# Patient Record
Sex: Male | Born: 1990 | Race: White | Hispanic: No | Marital: Married | Smoking: Former smoker
Health system: Southern US, Community
[De-identification: ages and names within clinical notes are randomized; demographics above are authoritative.]

## PROBLEM LIST (undated history)

## (undated) DIAGNOSIS — I480 Paroxysmal atrial fibrillation: Secondary | ICD-10-CM

## (undated) DIAGNOSIS — J45909 Unspecified asthma, uncomplicated: Secondary | ICD-10-CM

## (undated) HISTORY — PX: TONSILLECTOMY: SUR1361

## (undated) HISTORY — DX: Unspecified asthma, uncomplicated: J45.909

## (undated) HISTORY — DX: Paroxysmal atrial fibrillation: I48.0

---
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
## 2008-01-07 ENCOUNTER — Emergency Department: Payer: Self-pay | Admitting: Emergency Medicine

## 2009-01-23 ENCOUNTER — Emergency Department: Payer: Self-pay | Admitting: Unknown Physician Specialty

## 2009-12-18 ENCOUNTER — Emergency Department: Payer: Self-pay | Admitting: Emergency Medicine

## 2009-12-20 ENCOUNTER — Emergency Department: Payer: Self-pay | Admitting: Emergency Medicine

## 2009-12-22 ENCOUNTER — Emergency Department: Payer: Self-pay | Admitting: Emergency Medicine

## 2010-02-28 ENCOUNTER — Emergency Department: Payer: Self-pay | Admitting: Unknown Physician Specialty

## 2010-10-02 ENCOUNTER — Emergency Department: Payer: Self-pay | Admitting: Emergency Medicine

## 2012-01-03 IMAGING — CT CT STONE STUDY
1 of 2 series · 15 of 32 positions shown, 19 images · non-contrast
Comparison: None

REASON FOR EXAM: Increasing left flnak pain
COMMENTS:   LMP: (Male)

PROCEDURE:     CT  - CT ABDOMEN /PELVIS WO (STONE)  - February 28, 2010  [DATE]
RESULT:     Indication: Left flank pain
TECHNIQUE: Multiple axial images from the lung bases to the symphysis pubis
were obtained without oral and without intravenous contrast.

[Series 2: stone · axial · 0.78mm/px · z∈[+281,+716]mm · 15 of 165 slices shown, 19 images]
[im 13/165  soft-tissue]
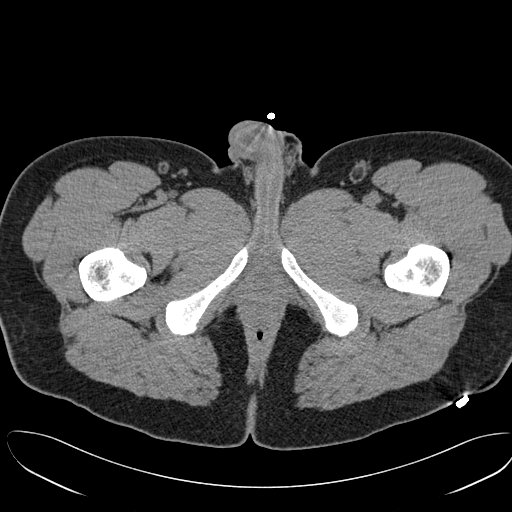
[im 13/165  bone]
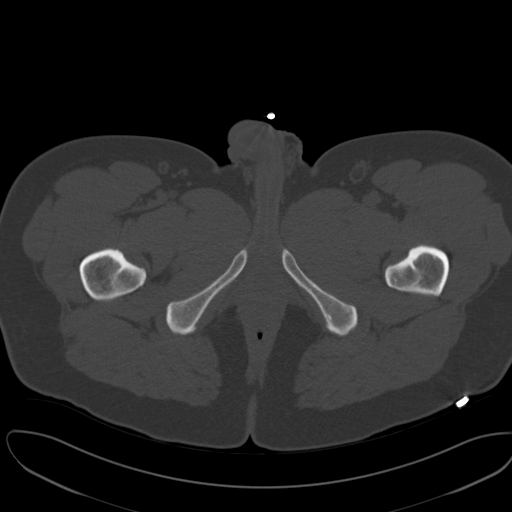
[im 25/165  soft-tissue]
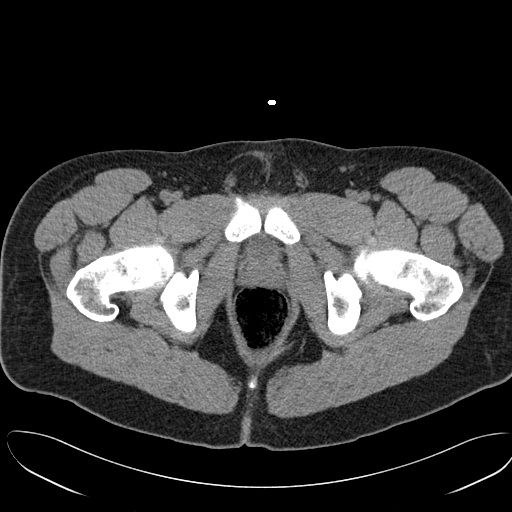
[im 37/165  soft-tissue]
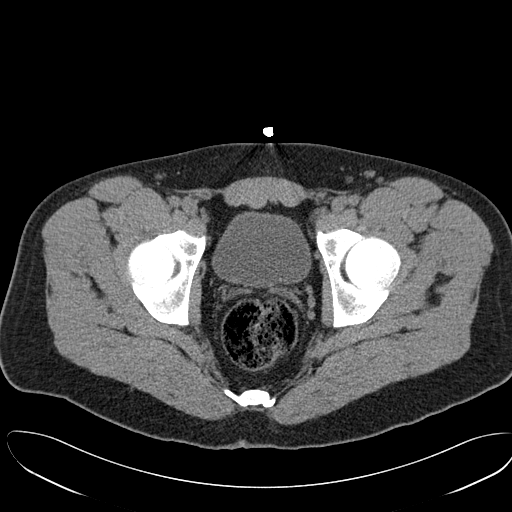
[im 49/165  soft-tissue]
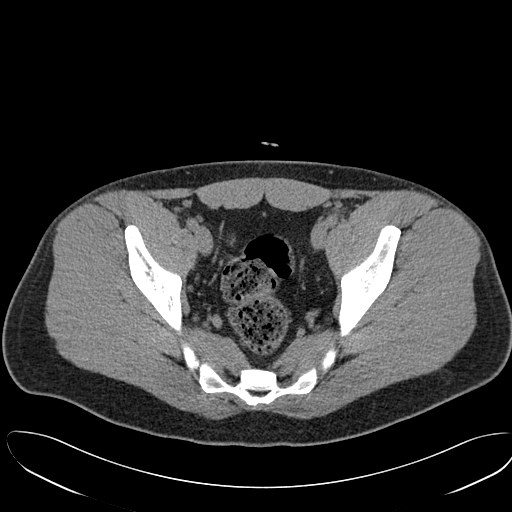
[im 61/165  soft-tissue]
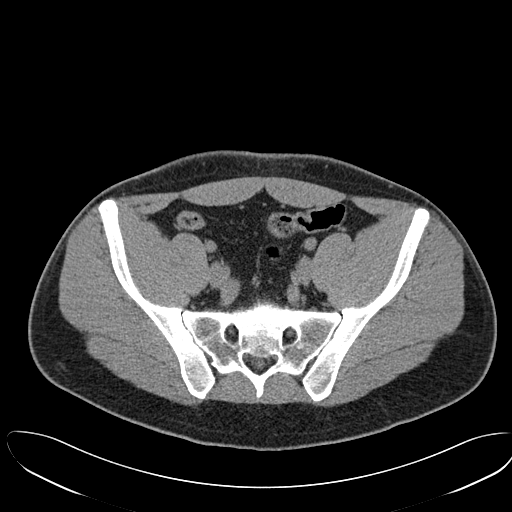
[im 73/165  soft-tissue]
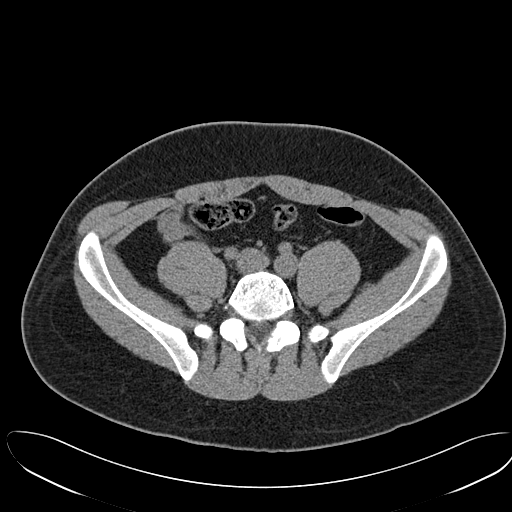
[im 86/165  soft-tissue]
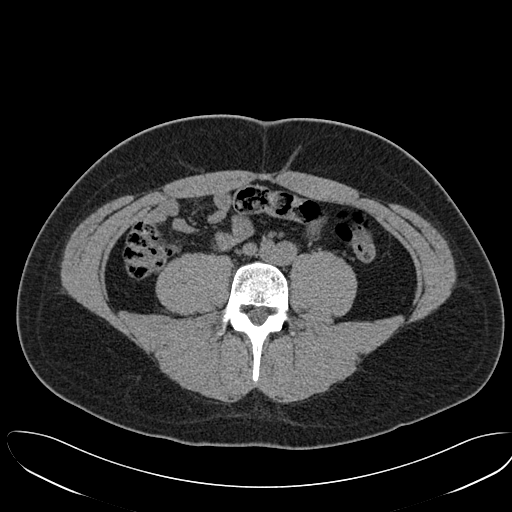
[im 98/165  soft-tissue]
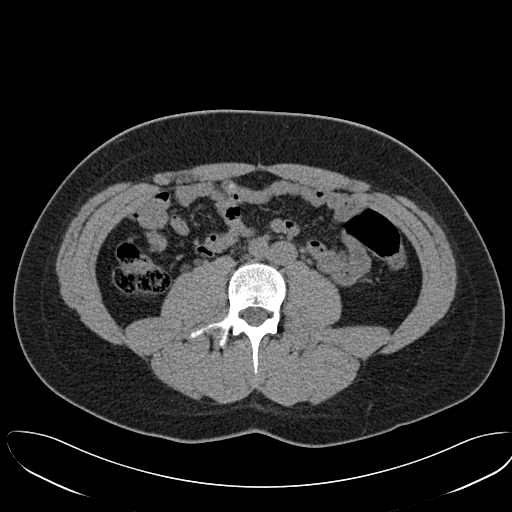
[im 110/165  soft-tissue]
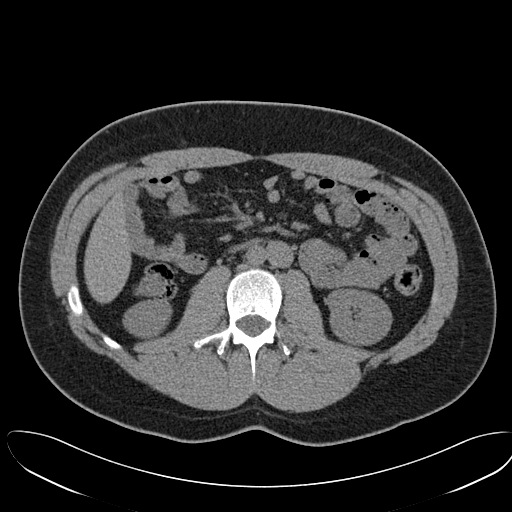
[im 110/165  bone]
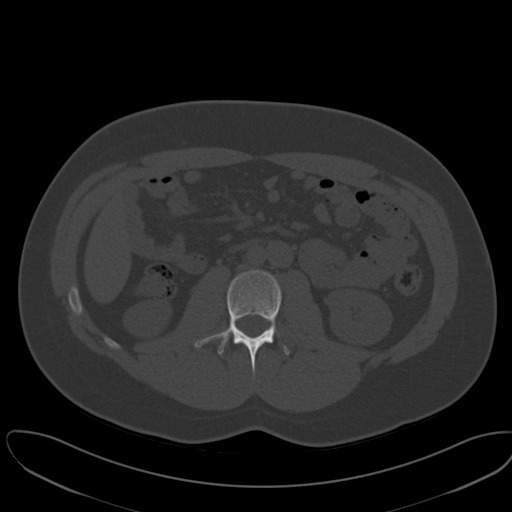
[im 122/165  soft-tissue]
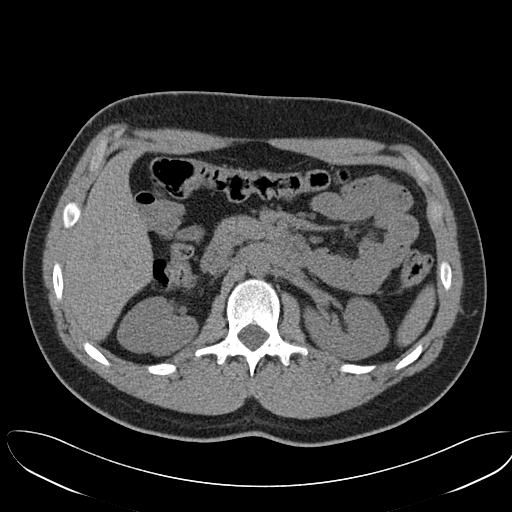
[im 134/165  soft-tissue]
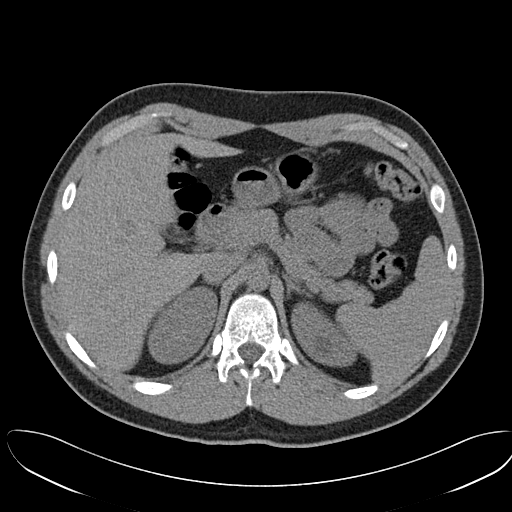
[im 140/165  lung]
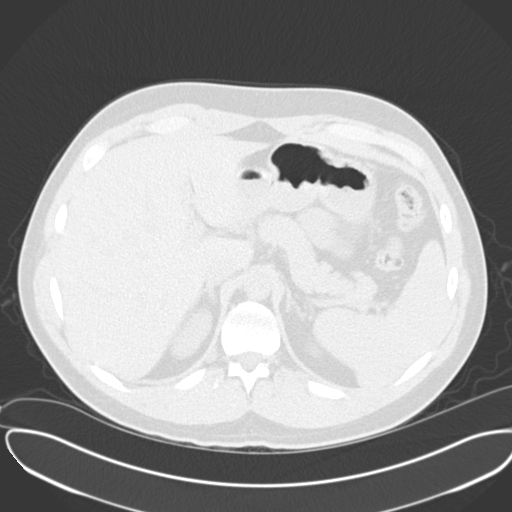
[im 146/165  soft-tissue]
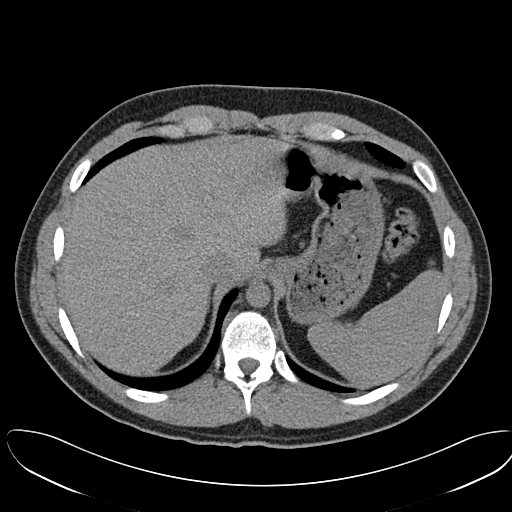
[im 146/165  lung]
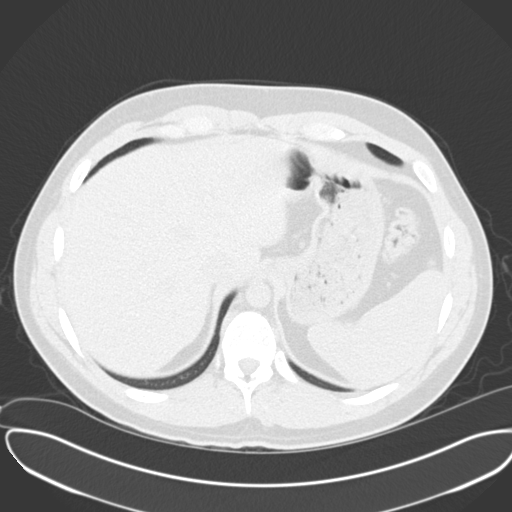
[im 152/165  lung]
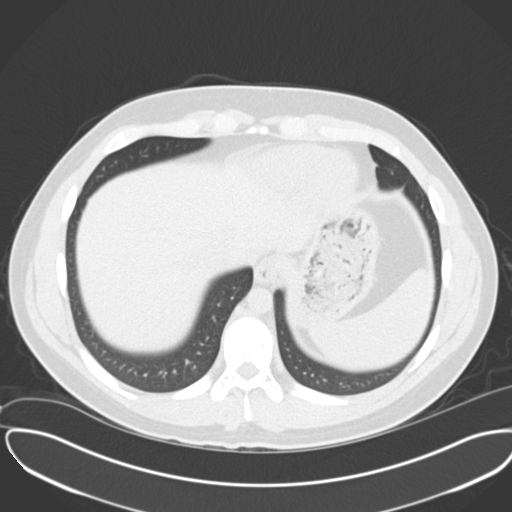
[im 158/165  soft-tissue]
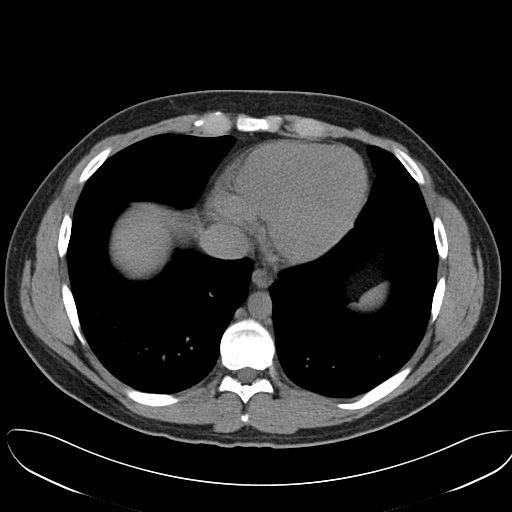
[im 158/165  lung]
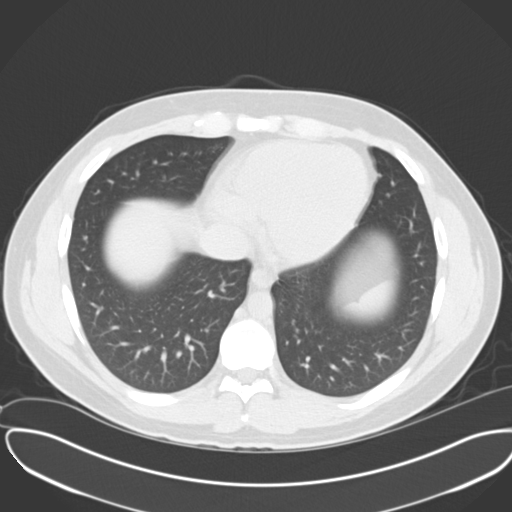

[15 of 32 positions shown; findings below may reference images not displayed]

FINDINGS: The lung bases are clear. There is no pleural or pericardial effusions.

No renal, ureteral, or bladder calculi. No obstructive uropathy. No
perinephric stranding is seen. The kidneys are symmetric in size . There is
a 2.2 x 1.5 cm hypodense, fluid attenuating left renal mass most consistent
with a cyst. The bladder is unremarkable.

The liver demonstrates no focal abnormality. The gallbladder is
unremarkable. The spleen demonstrates no focal abnormality. The adrenal
glands and pancreas are normal.

The unopacified stomach, duodenum, small intestine, and large intestine are
unremarkable, but evaluation is limited by lack of oral contrast. There is a
normal caliber appendix in the right lower quadrant without periappendiceal
inflammatory changes. There is no pneumoperitoneum, pneumatosis, or portal
venous gas. There is no abdominal or pelvic free fluid. There is no
lymphadenopathy.

The abdominal aorta is normal in caliber with atherosclerosis.

The osseous structures are unremarkable.
IMPRESSION: 1. No urolithiasis or obstructive uropathy.

## 2014-12-28 ENCOUNTER — Encounter: Payer: Self-pay | Admitting: Emergency Medicine

## 2014-12-28 ENCOUNTER — Emergency Department: Payer: 59

## 2014-12-28 ENCOUNTER — Other Ambulatory Visit: Payer: Self-pay

## 2014-12-28 ENCOUNTER — Emergency Department
Admission: EM | Admit: 2014-12-28 | Discharge: 2014-12-28 | Disposition: A | Discharge status: Home / Self Care | Payer: 59 | Attending: Emergency Medicine | Admitting: Emergency Medicine

## 2014-12-28 DIAGNOSIS — R079 Chest pain, unspecified: Secondary | ICD-10-CM | POA: Diagnosis present

## 2014-12-28 DIAGNOSIS — Z720 Tobacco use: Secondary | ICD-10-CM | POA: Diagnosis not present

## 2014-12-28 DIAGNOSIS — E876 Hypokalemia: Secondary | ICD-10-CM | POA: Diagnosis not present

## 2014-12-28 DIAGNOSIS — E871 Hypo-osmolality and hyponatremia: Secondary | ICD-10-CM | POA: Insufficient documentation

## 2014-12-28 LAB — COMPREHENSIVE METABOLIC PANEL
ALT: 31 U/L (ref 17–63)
AST: 27 U/L (ref 15–41)
Albumin: 5.3 g/dL — ABNORMAL HIGH (ref 3.5–5.0)
Alkaline Phosphatase: 67 U/L (ref 38–126)
Anion gap: 12 (ref 5–15)
BILIRUBIN TOTAL: 1.2 mg/dL (ref 0.3–1.2)
BUN: 20 mg/dL (ref 6–20)
CALCIUM: 9.2 mg/dL (ref 8.9–10.3)
CO2: 23 mmol/L (ref 22–32)
CREATININE: 1.66 mg/dL — AB (ref 0.61–1.24)
Chloride: 93 mmol/L — ABNORMAL LOW (ref 101–111)
GFR, EST NON AFRICAN AMERICAN: 56 mL/min — AB (ref >60)
Glucose, Bld: 146 mg/dL — ABNORMAL HIGH (ref 65–99)
Potassium: 2.8 mmol/L — CL (ref 3.5–5.1)
Sodium: 128 mmol/L — ABNORMAL LOW (ref 135–145)
TOTAL PROTEIN: 8.9 g/dL — AB (ref 6.5–8.1)

## 2014-12-28 LAB — CBC WITH DIFFERENTIAL/PLATELET
BASOS ABS: 0 10*3/uL (ref 0–0.1)
Basophils Relative: 0 %
EOS PCT: 0 %
Eosinophils Absolute: 0 10*3/uL (ref 0–0.7)
HEMATOCRIT: 45.4 % (ref 40.0–52.0)
Hemoglobin: 15.6 g/dL (ref 13.0–18.0)
LYMPHS ABS: 2.6 10*3/uL (ref 1.0–3.6)
LYMPHS PCT: 20 %
MCH: 30.3 pg (ref 26.0–34.0)
MCHC: 34.3 g/dL (ref 32.0–36.0)
MCV: 88.3 fL (ref 80.0–100.0)
MONO ABS: 1.4 10*3/uL — AB (ref 0.2–1.0)
Monocytes Relative: 10 %
NEUTROS ABS: 9.1 10*3/uL — AB (ref 1.4–6.5)
Neutrophils Relative %: 70 %
Platelets: 190 10*3/uL (ref 150–440)
RBC: 5.14 MIL/uL (ref 4.40–5.90)
RDW: 13.8 % (ref 11.5–14.5)
WBC: 13.2 10*3/uL — AB (ref 3.8–10.6)

## 2014-12-28 LAB — TROPONIN I

## 2014-12-28 MED ORDER — SODIUM CHLORIDE 0.9 % IV BOLUS (SEPSIS)
1000.0000 mL | Freq: Once | INTRAVENOUS | Status: AC
  Administered 2014-12-28: 1000 mL via INTRAVENOUS

## 2014-12-28 MED ORDER — POTASSIUM CHLORIDE CRYS ER 20 MEQ PO TBCR
40.0000 meq | EXTENDED_RELEASE_TABLET | Freq: Once | ORAL | Status: AC
  Administered 2014-12-28: 40 meq via ORAL
  Filled 2014-12-28: qty 2

## 2014-12-28 MED ORDER — ASPIRIN 81 MG PO CHEW
81.0000 mg | CHEWABLE_TABLET | Freq: Once | ORAL | Status: AC
Start: 2014-12-28 — End: 2014-12-28
  Administered 2014-12-28: 81 mg via ORAL
  Filled 2014-12-28: qty 1

## 2014-12-28 NOTE — Discharge Instructions (Signed)
You were evaluated for on and off chest discomfort, and no certain cause was found, however your exam and evaluation are reassuring. As we discussed I suspect he illness has contributed to finding your electrolytes of potassium and sodium low. You were given replacement here in the emergency department. Giving your risk factor of smoking, and a possible exertional component, I am referring her to see cardiology for further evaluation and possible stress test. No exertional activity or outside work until your seen by cardiologist. Call Dr. call was office in the morning and he will set up an appointment in 1-2 days. Take baby aspirin daily.  Return to the emergency room for any new or worsening condition including chest pain, trouble breathing, nausea, sweating, vomiting, or any other symptoms concerning to you.   Chest Pain (Nonspecific) It is often hard to give a specific diagnosis for the cause of chest pain. There is always a chance that your pain could be related to something serious, such as a heart attack or a blood clot in the lungs. You need to follow up with your health care provider for further evaluation. CAUSES   Heartburn.  Pneumonia or bronchitis.  Anxiety or stress.  Inflammation around your heart (pericarditis) or lung (pleuritis or pleurisy).  A blood clot in the lung.  A collapsed lung (pneumothorax). It can develop suddenly on its own (spontaneous pneumothorax) or from trauma to the chest.  Shingles infection (herpes zoster virus). The chest wall is composed of bones, muscles, and cartilage. Any of these can be the source of the pain.  The bones can be bruised by injury.  The muscles or cartilage can be strained by coughing or overwork.  The cartilage can be affected by inflammation and become sore (costochondritis). DIAGNOSIS  Lab tests or other studies may be needed to find the cause of your pain. Your health care provider may have you take a test called an  ambulatory electrocardiogram (ECG). An ECG records your heartbeat patterns over a 24-hour period. You may also have other tests, such as:  Transthoracic echocardiogram (TTE). During echocardiography, sound waves are used to evaluate how blood flows through your heart.  Transesophageal echocardiogram (TEE).  Cardiac monitoring. This allows your health care provider to monitor your heart rate and rhythm in real time.  Holter monitor. This is a portable device that records your heartbeat and can help diagnose heart arrhythmias. It allows your health care provider to track your heart activity for several days, if needed.  Stress tests by exercise or by giving medicine that makes the heart beat faster. TREATMENT   Treatment depends on what may be causing your chest pain. Treatment may include:  Acid blockers for heartburn.  Anti-inflammatory medicine.  Pain medicine for inflammatory conditions.  Antibiotics if an infection is present.  You may be advised to change lifestyle habits. This includes stopping smoking and avoiding alcohol, caffeine, and chocolate.  You may be advised to keep your head raised (elevated) when sleeping. This reduces the chance of acid going backward from your stomach into your esophagus. Most of the time, nonspecific chest pain will improve within 2-3 days with rest and mild pain medicine.  HOME CARE INSTRUCTIONS   If antibiotics were prescribed, take them as directed. Finish them even if you start to feel better.  For the next few days, avoid physical activities that bring on chest pain. Continue physical activities as directed.  Do not use any tobacco products, including cigarettes, chewing tobacco, or electronic cigarettes.  Avoid drinking alcohol.  Only take medicine as directed by your health care provider.  Follow your health care provider's suggestions for further testing if your chest pain does not go away.  Keep any follow-up appointments you  made. If you do not go to an appointment, you could develop lasting (chronic) problems with pain. If there is any problem keeping an appointment, call to reschedule. SEEK MEDICAL CARE IF:   Your chest pain does not go away, even after treatment.  You have a rash with blisters on your chest.  You have a fever. SEEK IMMEDIATE MEDICAL CARE IF:   You have increased chest pain or pain that spreads to your arm, neck, jaw, back, or abdomen.  You have shortness of breath.  You have an increasing cough, or you cough up blood.  You have severe back or abdominal pain.  You feel nauseous or vomit.  You have severe weakness.  You faint.  You have chills. This is an emergency. Do not wait to see if the pain will go away. Get medical help at once. Call your local emergency services (911 in [place] Do not drive yourself to the hospital. MAKE SURE YOU:   Understand these instructions.  Will watch your condition.  Will get help right away if you are not doing well or get worse. Document Released: 02/06/2005 Document Revised: 05/04/2013 Document Reviewed: 12/03/2007 [facility] Patient Information 2015 [place], [place]. This information is not intended to replace advice given to you by your health care provider. Make sure you discuss any questions you have with your health care provider.

## 2014-12-28 NOTE — ED Notes (Signed)
MD at bedside. 

## 2014-12-28 NOTE — ED Notes (Signed)
Lab called with a potassium of 2.8  Charge nurse greg rn aware.

## 2014-12-28 NOTE — ED Notes (Addendum)
Reports cp off and on x 4 months, today had a really sharp pain and felt weak.  Reports being under more stress than usual. States in last 6 months he got charged as a fellon for drugs, lost his job and his girlfriend of 6 years recently died.

## 2014-12-28 NOTE — ED Provider Notes (Signed)
[facility] Emergency Department Provider Note   ____________________________________________  Time seen: 8:10 PM I have reviewed the triage vital signs and the triage nursing note.  HISTORY  Chief Complaint Chest Pain   Historian Patient  HPI Cody Kramer is a 24 y.o. male who had some left-sided chest discomfort at work today and came in for evaluation after being sent home from work. He does work outside in the extreme heat, and does some exertional work. Sometimes the chest discomfort comes on during exertion and sometimes at rest. Located in the left upper chest area and is sometimes associated with a "knot" that is sort to palpation. No family history of young MIs. Patient is a smoker. He is also under a lot of stress. Patient has had the symptoms of chest pain on and off for several months. Currently he is chest pain-free. No shortness breath and no pleuritic chest pain.    History reviewed. No pertinent past medical history.  There are no active problems to display for this patient.   History reviewed. No pertinent past surgical history.  No current outpatient prescriptions on file.  Allergies Review of patient's allergies indicates no known allergies.  History reviewed. No pertinent family history.  Social History Social History  Substance Use Topics  . Smoking status: Current Every Day Smoker  . Smokeless tobacco: None  . Alcohol Use: Yes    Review of Systems  Constitutional: Negative for fever. Eyes: Negative for visual changes. ENT: Negative for sore throat. Cardiovascular: Negative for palpitations or pleuritic chest pain Respiratory: Negative for shortness of breath or cough Gastrointestinal: Negative for abdominal pain, vomiting and diarrhea. Genitourinary: Negative for dysuria. Musculoskeletal: Negative for back pain. Skin: Negative for rash. Neurological: Negative for headaches, focal weakness or numbness. 10 point  Review of Systems otherwise negative ____________________________________________   PHYSICAL EXAM:  VITAL SIGNS: ED Triage Vitals  Enc Vitals Group     BP 12/28/14 1829 144/81 mmHg     Pulse Rate 12/28/14 1829 99     Resp 12/28/14 1829 18     Temp --      Temp src --      SpO2 12/28/14 1825 98 %     Weight --      Height --      Head Cir --      Peak Flow --      Pain Score 12/28/14 1638 1     Pain Loc --      Pain Edu? --      Excl. in GC? --      Constitutional: Alert and oriented. Well appearing and in no distress. Eyes: Conjunctivae are normal. PERRL. Normal extraocular movements. ENT   Head: Normocephalic and atraumatic.   Nose: No congestion/rhinnorhea.   Mouth/Throat: Mucous membranes are moist.   Neck: No stridor. Cardiovascular/Chest: Normal rate, regular rhythm.  No murmurs, rubs, or gallops. Respiratory: Normal respiratory effort without tachypnea nor retractions. Breath sounds are clear and equal bilaterally. No wheezes/rales/rhonchi. Gastrointestinal: Soft. No distention, no guarding, no rebound. Nontender   Genitourinary/rectal:Deferred Musculoskeletal: Nontender with normal range of motion in all extremities. No joint effusions.  No lower extremity tenderness nor edema. Neurologic:  Normal speech and language. No gross or focal neurologic deficits are appreciated. Skin:  Skin is warm, dry and intact. No rash noted. Psychiatric: Mood and affect are normal. Speech and behavior are normal. Patient exhibits appropriate insight and judgment.  ____________________________________________   EKG I, Governor Rooks, MD, the  attending physician have personally viewed and interpreted all ECGs.  109 bpm. Sinus tachycardia.  QRS. Normal axis. Normal ST and T-wave. ____________________________________________  LABS (pertinent positives/negatives)  White blood count 13.2, hemoglobin 15.6, platelet count 198 Complete metabolic panel significant for  sodium 128, potassium 2.8, chloride 93, glucose 146, BUN 20 and creatinine 1.66  troponin less than 0.03 Repeat troponin less than 0.03 ____________________________________________  RADIOLOGY All Xrays were viewed by me. Imaging interpreted by Radiologist.  Chest x-ray portable: Negative __________________________________________  PROCEDURES  Procedure(s) performed: None Critical Care performed: None  ____________________________________________   ED COURSE / ASSESSMENT AND PLAN  CONSULTATIONS: None  Pertinent labs & imaging results that were available during my care of the patient were reviewed by me and considered in my medical decision making (see chart for details).   Patient's chest pain is very atypical and found To be muscle spasm related. Due to his history of smoking, I refer him to follow-up with a cardiologist. His exam and evaluation are reassuring from this standpoint. Of note the patient does work outside in the extreme heat, and his laboratory findings are consistent with likely heat illness source of abnormal electrolytes. Patient himself is actually very well appearing, and I have given him 1 L of normal saline as well as 40 mEq of KCl.  Patient / Family / Caregiver informed of clinical course, medical decision-making process, and agree with plan.   I discussed return precautions, follow-up instructions, and discharged instructions with patient and/or family.  ___________________________________________   FINAL CLINICAL IMPRESSION(S) / ED DIAGNOSES   Final diagnoses:  Chest pain, unspecified chest pain type  Hypokalemia  Hyponatremia    FOLLOW UP  Referred to: Primary care physician, one week   Governor Rooks, MD 12/28/14 2217

## 2016-08-12 ENCOUNTER — Encounter: Payer: Self-pay | Admitting: Emergency Medicine

## 2016-08-12 ENCOUNTER — Emergency Department
Admission: EM | Admit: 2016-08-12 | Discharge: 2016-08-12 | Disposition: A | Discharge status: Home / Self Care | Payer: 59 | Attending: Emergency Medicine | Admitting: Emergency Medicine

## 2016-08-12 DIAGNOSIS — Y999 Unspecified external cause status: Secondary | ICD-10-CM | POA: Insufficient documentation

## 2016-08-12 DIAGNOSIS — W109XXA Fall (on) (from) unspecified stairs and steps, initial encounter: Secondary | ICD-10-CM | POA: Insufficient documentation

## 2016-08-12 DIAGNOSIS — S76111A Strain of right quadriceps muscle, fascia and tendon, initial encounter: Secondary | ICD-10-CM | POA: Diagnosis not present

## 2016-08-12 DIAGNOSIS — Y929 Unspecified place or not applicable: Secondary | ICD-10-CM | POA: Diagnosis not present

## 2016-08-12 DIAGNOSIS — Y9389 Activity, other specified: Secondary | ICD-10-CM | POA: Diagnosis not present

## 2016-08-12 DIAGNOSIS — F17200 Nicotine dependence, unspecified, uncomplicated: Secondary | ICD-10-CM | POA: Diagnosis not present

## 2016-08-12 DIAGNOSIS — S8991XA Unspecified injury of right lower leg, initial encounter: Secondary | ICD-10-CM | POA: Diagnosis present

## 2016-08-12 MED ORDER — MELOXICAM 7.5 MG PO TABS: 7.5000 mg | ORAL_TABLET | Freq: Every day | ORAL | 1 refills | Status: AC

## 2016-08-12 NOTE — ED Provider Notes (Signed)
[facility] Emergency Department Provider Note  ____________________________________________  Time seen: Approximately 3:45 PM  I have reviewed the triage vital signs and the nursing notes.   HISTORY  Chief Complaint Leg Pain    HPI Cody Kramer is a 26 y.o. male presenting to the emergency department with 6 out of 10 aching right quadriceps pain. Patient states that he descended from a ladder approximately 2 weeks ago and came down "funny". Patient denies falling during the incident. Patient states that he has noticed bruising along the distribution of the distal quadriceps. He states that his pain is worsened with extension at the knee and with climbing stairs. Patient initially sought care with a local urgent care who advised patient to use ibuprofen. No other alleviating measures and attempted.   History reviewed. No pertinent past medical history.  There are no active problems to display for this patient.   No past surgical history on file.  Prior to Admission medications   Medication Sig Start Date End Date Taking? Authorizing Provider  meloxicam (MOBIC) 7.5 MG tablet Take 1 tablet (7.5 mg total) by mouth daily. 08/12/16 08/19/16  Orvil Feil, PA-C    Allergies Patient has no known allergies.  No family history on file.  Social History Social History  Substance Use Topics  . Smoking status: Current Every Day Smoker  . Smokeless tobacco: Not on file  . Alcohol use Yes    Review of Systems  Constitutional: No fever/chills Cardiovascular: no chest pain. Respiratory: no cough. No SOB. Musculoskeletal: Patient has right quadriceps pain. Skin: Patient has residual bruising along the distribution of the right distal quadriceps. Neurological: Negative for headaches, focal weakness or numbness. ____________________________________________   PHYSICAL EXAM:  VITAL SIGNS: ED Triage Vitals  Enc Vitals Group     BP 08/12/16 1442 140/77      Pulse --      Resp 08/12/16 1442 18     Temp 08/12/16 1442 97.6 F (36.4 C)     Temp Source 08/12/16 1442 Oral     SpO2 08/12/16 1442 99 %     Weight 08/12/16 1353 230 lb (104.3 kg)     Height 08/12/16 1353 6' (1.829 m)     Head Circumference --      Peak Flow --      Pain Score 08/12/16 1353 6     Pain Loc --      Pain Edu? --      Excl. in GC? --      Constitutional: Alert and oriented. Well appearing and in no acute distress. Eyes: Conjunctivae are normal. PERRL. EOMI. Head: Atraumatic. Cardiovascular: Normal rate, regular rhythm. Normal S1 and S2.  Good peripheral circulation. Respiratory: Normal respiratory effort without tachypnea or retractions. Lungs CTAB. Good air entry to the bases with no decreased or absent breath sounds. Musculoskeletal: Patient has 5 out of 5 strength in the lower extremities bilaterally. Patient has full range of motion at the hip, knee and ankle bilaterally and symmetrically. Right lower extremity: Patient has pain elicited with flexion at the hip and resisted extension at the knee. No palpable defect superior to the patella. Palpable dorsalis pedis pulse bilaterally and symmetrically. Neurologic:  Normal speech and language. No gross focal neurologic deficits are appreciated. Reflexes are 2+ and symmetric in the lower extremities bilaterally. Skin:  Patient has ecchymosis visualized at right distal quadriceps. Psychiatric: Mood and affect are normal. Speech and behavior are normal. Patient exhibits appropriate insight and judgement.  ____________________________________________   LABS (all labs ordered are listed, but only abnormal results are displayed)  Labs Reviewed - No data to display ____________________________________________  EKG   ____________________________________________  RADIOLOGY   No results found.  ____________________________________________    PROCEDURES  Procedure(s) performed:     Procedures    Medications - No data to display   ____________________________________________   INITIAL IMPRESSION / ASSESSMENT AND PLAN / ED COURSE  Pertinent labs & imaging results that were available during my care of the patient were reviewed by me and considered in my medical decision making (see chart for details).  Review of the [place] CSRS was performed in accordance of the [facility] prior to dispensing any controlled drugs.     Assessment and plan: Quadriceps Strain:  Patient presents to the emergency department with right distal quadriceps pain after descending down a ladder 2 weeks ago. On physical exam, patient has no palpable defect with 5 out of 5 strength. Patient's extensor mechanism is intact, right lower extremity. I suspect quadriceps strain. A referral was given to orthopedics, Dr. Joice Lofts. Vital signs are reassuring at this time. All patient questions were answered. ____________________________________________  FINAL CLINICAL IMPRESSION(S) / ED DIAGNOSES  Final diagnoses:  Strain of right quadriceps, initial encounter      NEW MEDICATIONS STARTED DURING THIS VISIT:  New Prescriptions   MELOXICAM (MOBIC) 7.5 MG TABLET    Take 1 tablet (7.5 mg total) by mouth daily.        This chart was dictated using voice recognition software/Dragon. Despite best efforts to proofread, errors can occur which can change the meaning. Any change was purely unintentional.    Orvil Feil, PA-C 08/12/16 1556    Minna Antis, MD 08/12/16 2241

## 2016-08-12 NOTE — ED Notes (Signed)
See triage note  States he has been having pain to right leg for about 3-4 weeks  States he missed a step going down a ladder  Felt a pull in right thigh..states  Was seen by urgent care   states he did have swelling and bruising noted to right thigh area   Bruising is gone but conts to have pain

## 2016-08-12 NOTE — ED Triage Notes (Signed)
Pt seen 3 weeks ago for right leg pain and told to f/u if continued pain for MRI.

## 2016-11-01 IMAGING — CR DG CHEST 1V PORT
1 series · 1 of 1 positions shown · non-contrast
Comparison: None currently available

CLINICAL DATA: Chest pain on and off for 4 months

EXAM:
PORTABLE CHEST - 1 VIEW

[ap]
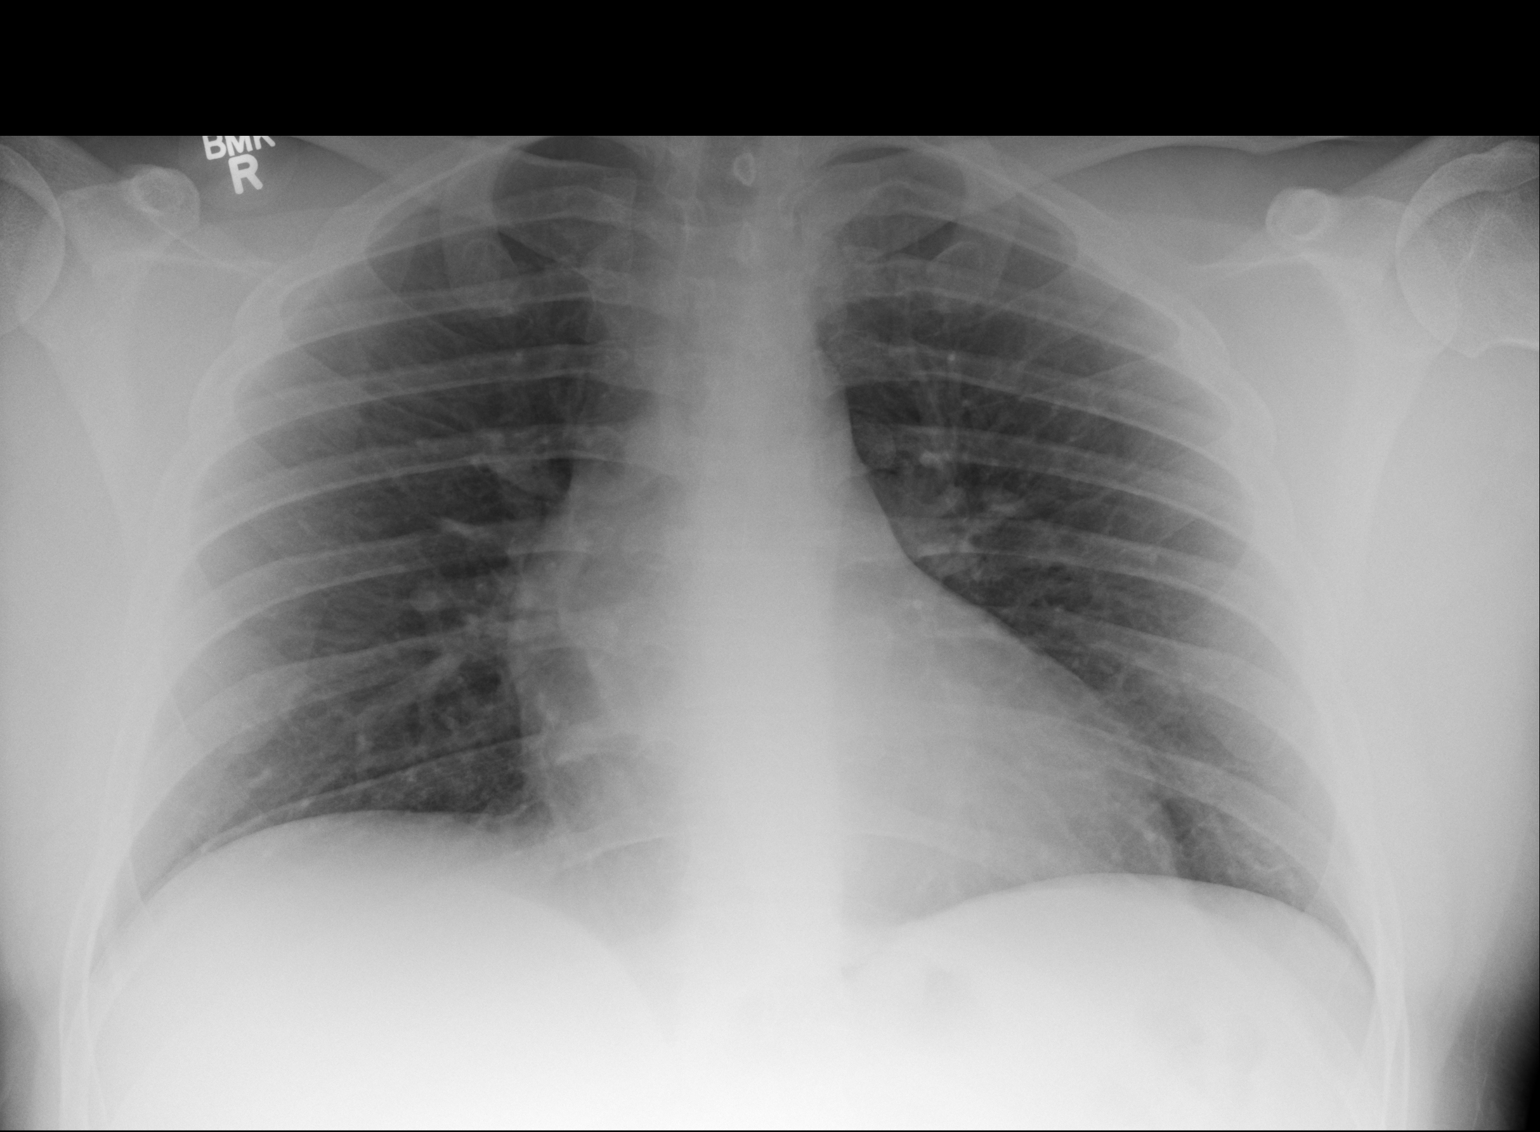

[1 of 1 positions shown; findings below may reference images not displayed]

FINDINGS: Normal heart size and mediastinal contours. No acute infiltrate or
edema. No effusion or pneumothorax. No acute osseous findings.
IMPRESSION: Negative portable chest.

## 2018-04-28 ENCOUNTER — Emergency Department: Payer: 59

## 2018-04-28 ENCOUNTER — Emergency Department
Admission: EM | Admit: 2018-04-28 | Discharge: 2018-04-28 | Disposition: A | Discharge status: Home / Self Care | Payer: 59 | Attending: Emergency Medicine | Admitting: Emergency Medicine

## 2018-04-28 ENCOUNTER — Encounter: Payer: Self-pay | Admitting: Emergency Medicine

## 2018-04-28 DIAGNOSIS — I480 Paroxysmal atrial fibrillation: Secondary | ICD-10-CM | POA: Diagnosis not present

## 2018-04-28 DIAGNOSIS — F17210 Nicotine dependence, cigarettes, uncomplicated: Secondary | ICD-10-CM | POA: Insufficient documentation

## 2018-04-28 DIAGNOSIS — R0602 Shortness of breath: Secondary | ICD-10-CM | POA: Insufficient documentation

## 2018-04-28 LAB — COMPREHENSIVE METABOLIC PANEL
ALK PHOS: 59 U/L (ref 38–126)
ALT: 26 U/L (ref 0–44)
AST: 17 U/L (ref 15–41)
Albumin: 4.4 g/dL (ref 3.5–5.0)
Anion gap: 6 (ref 5–15)
BILIRUBIN TOTAL: 0.8 mg/dL (ref 0.3–1.2)
BUN: 18 mg/dL (ref 6–20)
CO2: 25 mmol/L (ref 22–32)
CREATININE: 0.85 mg/dL (ref 0.61–1.24)
Calcium: 9.3 mg/dL (ref 8.9–10.3)
Chloride: 108 mmol/L (ref 98–111)
GFR calc Af Amer: >60 mL/min (ref >60)
Glucose, Bld: 105 mg/dL — ABNORMAL HIGH (ref 70–99)
Potassium: 3.8 mmol/L (ref 3.5–5.1)
Sodium: 139 mmol/L (ref 135–145)
Total Protein: 7.2 g/dL (ref 6.5–8.1)

## 2018-04-28 LAB — CBC
HEMATOCRIT: 45.9 % (ref 39.0–52.0)
HEMOGLOBIN: 15.5 g/dL (ref 13.0–17.0)
MCH: 31 pg (ref 26.0–34.0)
MCHC: 33.8 g/dL (ref 30.0–36.0)
MCV: 91.8 fL (ref 80.0–100.0)
Platelets: 176 10*3/uL (ref 150–400)
RBC: 5 MIL/uL (ref 4.22–5.81)
RDW: 12.8 % (ref 11.5–15.5)
WBC: 9.4 10*3/uL (ref 4.0–10.5)
nRBC: 0 % (ref 0.0–0.2)

## 2018-04-28 LAB — TROPONIN I

## 2018-04-28 MED ORDER — METOPROLOL SUCCINATE ER 25 MG PO TB24: 25.0000 mg | ORAL_TABLET | Freq: Every day | ORAL | 0 refills | Status: DC

## 2018-04-28 MED ORDER — ASPIRIN 81 MG PO CHEW: 81.0000 mg | CHEWABLE_TABLET | Freq: Every day | ORAL | 0 refills | Status: AC

## 2018-04-28 NOTE — ED Triage Notes (Signed)
Pt reports was doing mixed martial arts last pm and he started feeling like his heart was beating funny and he couldn't breath that well. Pt reports he tried his girlfriends inhaler with no relief. Pt denies any type of heart history. Denies pain.

## 2018-04-28 NOTE — ED Notes (Signed)
First Nurse Note: Patient complaining of centralized chest pain and SHOB, to Triage 2 for EKG.  Color good, skin warm and dry.

## 2018-04-28 NOTE — ED Provider Notes (Signed)
[facility] Emergency Department Provider Note   ____________________________________________    I have reviewed the triage vital signs and the nursing notes.   HISTORY  Chief Complaint Irregular Heart Beat and Shortness of Breath     HPI Cody Kramer is a 27 y.o. male presents with complaints of an irregular heartbeat with shortness of breath which started yesterday while he was exercising.  He reports his heartbeat felt irregular and he felt short of breath even though he was not heavily exerting himself.  He states he works out 5 days a week and this is very abnormal for him.  He describes some tightness in his chest.  No fevers or chills or cough.  No calf pain or swelling.  No diaphoresis nausea or vomiting.  He did not take any for this.  Has never had this before.   History reviewed. No pertinent past medical history.  There are no active problems to display for this patient.   History reviewed. No pertinent surgical history.  Prior to Admission medications   Medication Sig Start Date End Date Taking? Authorizing Provider  aspirin (ASPIRIN CHILDRENS) 81 MG chewable tablet Chew 1 tablet (81 mg total) by mouth daily. 04/28/18 05/28/18  Jene Every, MD  metoprolol succinate (TOPROL XL) 25 MG 24 hr tablet Take 1 tablet (25 mg total) by mouth daily. 04/28/18 05/28/18  Jene Every, MD     Allergies Amoxicillin  No family history on file.  Social History Social History   Tobacco Use  . Smoking status: Current Every Day Smoker  Substance Use Topics  . Alcohol use: Yes  . Drug use: Not on file    Review of Systems  Constitutional: No fever/chills Eyes: No visual changes.  ENT: No sore throat. Cardiovascular: As above Respiratory: As above Gastrointestinal: No abdominal pain.     Genitourinary: Negative for dysuria. Musculoskeletal: Negative for back pain. Skin: Negative for diaphoresis Neurological: Negative for  headaches    ____________________________________________   PHYSICAL EXAM:  VITAL SIGNS: ED Triage Vitals [04/28/18 0825]  Enc Vitals Group     BP 138/68     Pulse Rate (!) 121     Resp 20     Temp 98.2 F (36.8 C)     Temp Source Oral     SpO2 98 %     Weight 107.5 kg (237 lb)     Height 1.829 m (6')     Head Circumference      Peak Flow      Pain Score 0     Pain Loc      Pain Edu?      Excl. in GC?     Constitutional: Alert and oriented. No acute distress.  Eyes: Conjunctivae are normal.   Nose: No congestion/rhinnorhea. Mouth/Throat: Mucous membranes are moist.    Cardiovascular: Normal rate, regular rhythm. Grossly normal heart sounds.  Good peripheral circulation. Respiratory: Normal respiratory effort.  No retractions. Lungs CTAB. Gastrointestinal: Soft and nontender. No distention.    Musculoskeletal: No lower extremity tenderness nor edema.  Warm and well perfused Neurologic:  Normal speech and language. No gross focal neurologic deficits are appreciated.  Skin:  Skin is warm, dry and intact. No rash noted. Psychiatric: Mood and affect are normal. Speech and behavior are normal.  ____________________________________________   LABS (all labs ordered are listed, but only abnormal results are displayed)  Labs Reviewed  COMPREHENSIVE METABOLIC PANEL - Abnormal; Notable for the following components:  Result Value   Glucose, Bld 105 (*)    All other components within normal limits  CBC  TROPONIN I   ____________________________________________  EKG  ED ECG REPORT I, Jene Everyobert Ravan Schlemmer, the attending physician, personally viewed and interpreted this ECG.  Date: 04/28/2018 EKG Time: 8:21 AM Rate: 105 Rhythm: Atrial fibrillation QRS Axis: normal Intervals: normal ST/T Wave abnormalities: normal Narrative Interpretation: no evidence of acute ischemia  ED ECG REPORT I, Jene Everyobert Samadhi Mahurin, the attending physician, personally viewed and interpreted this  ECG.  Date: 04/28/2018 EKG Time: 8:48 AM Rate: 89 Rhythm: normal sinus rhythm QRS Axis: normal Intervals: normal ST/T Wave abnormalities: normal Narrative Interpretation: no evidence of acute ischemia   ____________________________________________  RADIOLOGY  Chest x-ray questionable nodule discussed with patient, discussed the need for repeat outpatient chest x-ray ____________________________________________   PROCEDURES  Procedure(s) performed: No  Procedures   Critical Care performed: No ____________________________________________   INITIAL IMPRESSION / ASSESSMENT AND PLAN / ED COURSE  Pertinent labs & imaging results that were available during my care of the patient were reviewed by me and considered in my medical decision making (see chart for details).  Patient presents with initial EKG demonstrating atrial fibrillation, however patient seems to have spontaneously converted back to normal sinus rhythm based on second EKG and the fact that his symptoms resolved while in the emergency department.  Pending lab work, chest x-ray, will discuss with cardiology regarding possible beta-blockers given paroxysmal atrial fibrillation at his age.  Discussed with Dr. [place] of cardiology, recommends 81 mg aspirin, metoprolol succinate 25 mg with outpatient follow-up    ____________________________________________   FINAL CLINICAL IMPRESSION(S) / ED DIAGNOSES  Final diagnoses:  Paroxysmal atrial fibrillation (HCC)        Note:  This document was prepared using Dragon voice recognition software and may include unintentional dictation errors.    Jene EveryKinner, Cree Napoli, MD 04/28/18 1002

## 2018-05-07 DIAGNOSIS — R609 Edema, unspecified: Secondary | ICD-10-CM | POA: Insufficient documentation

## 2018-12-24 ENCOUNTER — Emergency Department
Admission: EM | Admit: 2018-12-24 | Discharge: 2018-12-24 | Disposition: A | Discharge status: Home / Self Care | Payer: 59 | Attending: Emergency Medicine | Admitting: Emergency Medicine

## 2018-12-24 ENCOUNTER — Encounter: Payer: Self-pay | Admitting: Emergency Medicine

## 2018-12-24 ENCOUNTER — Ambulatory Visit: Payer: 59 | Admitting: Internal Medicine

## 2018-12-24 ENCOUNTER — Telehealth: Payer: Self-pay

## 2018-12-24 ENCOUNTER — Other Ambulatory Visit: Payer: Self-pay

## 2018-12-24 DIAGNOSIS — Z5321 Procedure and treatment not carried out due to patient leaving prior to being seen by health care provider: Secondary | ICD-10-CM | POA: Diagnosis not present

## 2018-12-24 DIAGNOSIS — R079 Chest pain, unspecified: Secondary | ICD-10-CM | POA: Diagnosis present

## 2018-12-24 LAB — COMPREHENSIVE METABOLIC PANEL
ALT: 29 U/L (ref 0–44)
AST: 21 U/L (ref 15–41)
Albumin: 4.4 g/dL (ref 3.5–5.0)
Alkaline Phosphatase: 63 U/L (ref 38–126)
Anion gap: 7 (ref 5–15)
BUN: 22 mg/dL — ABNORMAL HIGH (ref 6–20)
CO2: 25 mmol/L (ref 22–32)
Calcium: 9.2 mg/dL (ref 8.9–10.3)
Chloride: 107 mmol/L (ref 98–111)
Creatinine, Ser: 1.04 mg/dL (ref 0.61–1.24)
GFR calc Af Amer: >60 mL/min (ref >60)
GFR calc non Af Amer: >60 mL/min (ref >60)
Glucose, Bld: 102 mg/dL — ABNORMAL HIGH (ref 70–99)
Potassium: 3.6 mmol/L (ref 3.5–5.1)
Sodium: 139 mmol/L (ref 135–145)
Total Bilirubin: 0.5 mg/dL (ref 0.3–1.2)
Total Protein: 7.4 g/dL (ref 6.5–8.1)

## 2018-12-24 LAB — CBC WITH DIFFERENTIAL/PLATELET
Abs Immature Granulocytes: 0.02 10*3/uL (ref 0.00–0.07)
Basophils Absolute: 0.1 10*3/uL (ref 0.0–0.1)
Basophils Relative: 1 %
Eosinophils Absolute: 0.3 10*3/uL (ref 0.0–0.5)
Eosinophils Relative: 3 %
HCT: 41.1 % (ref 39.0–52.0)
Hemoglobin: 14.2 g/dL (ref 13.0–17.0)
Immature Granulocytes: 0 %
Lymphocytes Relative: 36 %
Lymphs Abs: 3.3 10*3/uL (ref 0.7–4.0)
MCH: 30.3 pg (ref 26.0–34.0)
MCHC: 34.5 g/dL (ref 30.0–36.0)
MCV: 87.8 fL (ref 80.0–100.0)
Monocytes Absolute: 1 10*3/uL (ref 0.1–1.0)
Monocytes Relative: 11 %
Neutro Abs: 4.4 10*3/uL (ref 1.7–7.7)
Neutrophils Relative %: 49 %
Platelets: 191 10*3/uL (ref 150–400)
RBC: 4.68 MIL/uL (ref 4.22–5.81)
RDW: 12.5 % (ref 11.5–15.5)
WBC: 9 10*3/uL (ref 4.0–10.5)
nRBC: 0 % (ref 0.0–0.2)

## 2018-12-24 LAB — TROPONIN I (HIGH SENSITIVITY): Troponin I (High Sensitivity): 6 ng/L (ref <18)

## 2018-12-24 NOTE — ED Notes (Signed)
Attempted to locate pt to be taken to exam room. Unable to locate pt inside the waiting room or just outside the waiting room doors at this time. Pt did not announce leaving.

## 2018-12-24 NOTE — ED Triage Notes (Signed)
Patient ambulatory to triage with steady gait, without difficulty or distress noted, mask in place; pt reports midsternal CP x hr, nonradiating accomp by [facility]; st hx afib but meds stopped 41mos ago after being "cleared" by Dr Saralyn Pilar

## 2018-12-24 NOTE — Telephone Encounter (Signed)
Virtual Visit Pre-Appointment Phone Call  "(Name), I am calling you today to discuss your upcoming appointment. We are currently trying to limit exposure to the virus that causes COVID-19 by seeing patients at home rather than in the office."  1. "What is the BEST phone number to call the day of the visit?" - include this in appointment notes  2. Do you have or have access to (through a family member/friend) a smartphone with video capability that we can use for your visit?" a. If yes - list this number in appt notes as cell (if different from BEST phone #) and list the appointment type as a VIDEO visit in appointment notes b. If no - list the appointment type as a PHONE visit in appointment notes  3. Confirm consent - "In the setting of the current Covid19 crisis, you are scheduled for a (phone or video) visit with your provider on (date) at (time).  Just as we do with many in-office visits, in order for you to participate in this visit, we must obtain consent.  If you'd like, I can send this to your mychart (if signed up) or email for you to review.  Otherwise, I can obtain your verbal consent now.  All virtual visits are billed to your insurance company just like a normal visit would be.  By agreeing to a virtual visit, we'd like you to understand that the technology does not allow for your provider to perform an examination, and thus may limit your provider's ability to fully assess your condition. If your provider identifies any concerns that need to be evaluated in person, we will make arrangements to do so.  Finally, though the technology is pretty good, we cannot assure that it will always work on either your or our end, and in the setting of a video visit, we may have to convert it to a phone-only visit.  In either situation, we cannot ensure that we have a secure connection.  Are you willing to proceed?" STAFF: Did the patient verbally acknowledge consent to telehealth visit? Document  YES/NO here: yes  4. Advise patient to be prepared - "Two hours prior to your appointment, go ahead and check your blood pressure, pulse, oxygen saturation, and your weight (if you have the equipment to check those) and write them all down. When your visit starts, your provider will ask you for this information. If you have an Apple Watch or Kardia device, please plan to have heart rate information ready on the day of your appointment. Please have a pen and paper handy nearby the day of the visit as well."  5. Give patient instructions for MyChart download to smartphone OR Doximity/Doxy.me as below if video visit (depending on what platform provider is using)  6. Inform patient they will receive a phone call 15 minutes prior to their appointment time (may be from unknown caller ID) so they should be prepared to answer    TELEPHONE CALL NOTE  Cody Kramer has been deemed a candidate for a follow-up tele-health visit to limit community exposure during the Covid-19 pandemic. I spoke with the patient via phone to ensure availability of phone/video source, confirm preferred email & phone number, and discuss instructions and expectations.  I reminded Cody Kramer to be prepared with any vital sign and/or heart rhythm information that could potentially be obtained via home monitoring, at the time of his visit. I reminded Cody Kramer to expect a phone call prior to  his visit.  Clarisse Gouge 12/24/2018 10:39 AM   INSTRUCTIONS FOR DOWNLOADING THE MYCHART APP TO SMARTPHONE  - The patient must first make sure to have activated MyChart and know their login information - If Apple, go to [employer] and type in MyChart in the search bar and download the app. If Android, ask patient to go to [employer] and type in [place] in the search bar and download the app. The app is free but as with any other app downloads, their phone may require them to verify saved payment information or  Apple/Android password.  - The patient will need to then log into the app with their MyChart username and password, and select  as their healthcare provider to link the account. When it is time for your visit, go to the MyChart app, find appointments, and click Begin Video Visit. Be sure to Select Allow for your device to access the Microphone and Camera for your visit. You will then be connected, and your provider will be with you shortly.  **If they have any issues connecting, or need assistance please contact MyChart service desk [phone])**  **If using a computer, in order to ensure the best quality for their visit they will need to use either of the following Internet Browsers: [employer], or Google Chrome**  IF USING DOXIMITY or DOXY.ME - The patient will receive a link just prior to their visit by text.     FULL LENGTH CONSENT FOR TELE-HEALTH VISIT   I hereby voluntarily request, consent and authorize [facility] and its employed or contracted physicians, physician assistants, nurse practitioners or other licensed health care professionals (the Practitioner), to provide me with telemedicine health care services (the Services") as deemed necessary by the treating Practitioner. I acknowledge and consent to receive the Services by the Practitioner via telemedicine. I understand that the telemedicine visit will involve communicating with the Practitioner through live audiovisual communication technology and the disclosure of certain medical information by electronic transmission. I acknowledge that I have been given the opportunity to request an in-person assessment or other available alternative prior to the telemedicine visit and am voluntarily participating in the telemedicine visit.  I understand that I have the right to withhold or withdraw my consent to the use of telemedicine in the course of my care at any time, without affecting my right to future care  or treatment, and that the Practitioner or I may terminate the telemedicine visit at any time. I understand that I have the right to inspect all information obtained and/or recorded in the course of the telemedicine visit and may receive copies of available information for a reasonable fee.  I understand that some of the potential risks of receiving the Services via telemedicine include:   Delay or interruption in medical evaluation due to technological equipment failure or disruption;  Information transmitted may not be sufficient (e.g. poor resolution of images) to allow for appropriate medical decision making by the Practitioner; and/or   In rare instances, security protocols could fail, causing a breach of personal health information.  Furthermore, I acknowledge that it is my responsibility to provide information about my medical history, conditions and care that is complete and accurate to the best of my ability. I acknowledge that Practitioner's advice, recommendations, and/or decision may be based on factors not within their control, such as incomplete or inaccurate data provided by me or distortions of diagnostic images or specimens that may result from electronic transmissions. I  understand that the practice of medicine is not an exact science and that Practitioner makes no warranties or guarantees regarding treatment outcomes. I acknowledge that I will receive a copy of this consent concurrently upon execution via email to the email address I last provided but may also request a printed copy by calling the office of [facility].    I understand that my insurance will be billed for this visit.   I have read or had this consent read to me.  I understand the contents of this consent, which adequately explains the benefits and risks of the Services being provided via telemedicine.   I have been provided ample opportunity to ask questions regarding this consent and the Services and have had  my questions answered to my satisfaction.  I give my informed consent for the services to be provided through the use of telemedicine in my medical care  By participating in this telemedicine visit I agree to the above.

## 2018-12-25 ENCOUNTER — Telehealth: Payer: Self-pay | Admitting: Emergency Medicine

## 2018-12-25 NOTE — Telephone Encounter (Signed)
Called patient due to lwot to inquire about condition and follow up plans.  No answer and no voicemail  

## 2018-12-31 ENCOUNTER — Telehealth (INDEPENDENT_AMBULATORY_CARE_PROVIDER_SITE_OTHER): Payer: 59 | Admitting: Internal Medicine

## 2018-12-31 ENCOUNTER — Other Ambulatory Visit: Payer: Self-pay

## 2018-12-31 ENCOUNTER — Encounter: Payer: Self-pay | Admitting: Internal Medicine

## 2018-12-31 VITALS — Ht 72.0 in | Wt 250.0 lb

## 2018-12-31 DIAGNOSIS — Z0189 Encounter for other specified special examinations: Secondary | ICD-10-CM

## 2018-12-31 DIAGNOSIS — R002 Palpitations: Secondary | ICD-10-CM

## 2018-12-31 DIAGNOSIS — I480 Paroxysmal atrial fibrillation: Secondary | ICD-10-CM

## 2018-12-31 MED ORDER — METOPROLOL TARTRATE 25 MG PO TABS: ORAL_TABLET | ORAL | 2 refills | Status: DC

## 2018-12-31 NOTE — Progress Notes (Signed)
Virtual Visit via Video Note   This visit type was conducted due to national recommendations for restrictions regarding the COVID-19 Pandemic (e.g. social distancing) in an effort to limit this patient's exposure and mitigate transmission in our community.  Due to his co-morbid illnesses, this patient is at least at moderate risk for complications without adequate follow up.  This format is felt to be most appropriate for this patient at this time.  All issues noted in this document were discussed and addressed.  A limited physical exam was performed with this format.  Please refer to the patient's chart for his consent to telehealth for [facility].   Date:  01/01/2019   ID:  Cody ChristmasChristopher D Shupert, DOB 01/27/1991, MRN [id number]  Patient Location: Home Provider Location: Office  PCP:  Patient, No Pcp Per  Cardiologist:  Jamse MeadAlex Paraschos, MD Electrophysiologist:  None   Evaluation Performed:  New Patient Evaluation  Chief Complaint:  Atrial fibrillation  History of Present Illness:    Cody Kramer is a 28 y.o. male with with history of paroxysmal atrial fibrillation followed by Dr. Gillermo MurdochParaschos([facility] cardiology), who is being seen today for second opinion regarding atrial fibrillation.  [facility], at which time he was doing well.  Both aspirin and metoprolol were discontinued due to significant bruising and fatigue, respectively.  He presented to the emergency department on 12/24/2018 complaining of chest pain and shortness of breath.  However, he left before being seen.  Labs and EKG at that time were normal.  Atrial fibrillation was first diagnosed in 04/2018, when the patient presented with palpitations and shortness of breath.  He spontaneously converted to sinus rhythm and was discharged on metoprolol and aspirin.  Mr. Katrinka BlazingSmith reports 3 or 4 episodes of atrial fibrillation during his lifetime.  Typically, he has sudden onset of palpitations with  accompanying shortness of breath and chest tightness.  There are no exacerbating or alleviating factors, with episodes sometimes lasting 2 to 3 days before spontaneously terminating.  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  He also denies orthopnea, PND, and edema.  He has not had any significant bleeding.  He consumes coffee and soda on occasion.  He does not drink alcohol or use recreational drugs.  Mr. Katrinka BlazingSmith never been evaluated for sleep apnea but notes that his father has it.  He has been told that he snores and even stops breathing at times.  The patient does not have symptoms concerning for COVID-19 infection (fever, chills, cough, or new shortness of breath).    Past Medical History:  Diagnosis Date  . Paroxysmal atrial fibrillation (HCC)    No past surgical history on file.   No outpatient medications have been marked as taking for the 12/31/18 encounter (Telemedicine) with Bion Todorov, Cristal Deerhristopher, MD.     Allergies:   Amoxicillin   Social History   Tobacco Use  . Smoking status: Former Games developermoker  . Smokeless tobacco: Current User  Substance Use Topics  . Alcohol use: Yes  . Drug use: Not on file     Family Hx: The patient's family history includes Healthy in his father and mother.  ROS:   Please see the history of present illness.   All other systems reviewed and are negative.   Prior CV studies:   The following studies were reviewed today:  Echocardiogram ([facility]/22/2018 -  [facility]): LVEF >55% with mild MR and mild LAE.  72-Hour Holter Monitor (05/2018 [facility]): SR with occasional PAC's and PVC's.  No evidence of atrial fibrillation.   Labs/Other Tests and Data Reviewed:    EKG:  An ECG dated 12/24/2018 was personally reviewed today and demonstrated:  Normal sinus rhythm without abnormality.  Recent Labs: 12/24/2018: ALT 29;  BUN 22; Creatinine, Ser 1.04; Hemoglobin 14.2; Platelets 191; Potassium 3.6; Sodium 139   TSH (05/08/2019 [facility]): 3.438  Recent Lipid Panel No results found for: CHOL, TRIG, HDL, CHOLHDL, LDLCALC, LDLDIRECT  Wt Readings from Last 3 Encounters:  12/31/18 250 lb (113.4 kg)  12/24/18 254 lb (115.2 kg)  04/28/18 237 lb (107.5 kg)     Objective:    Vital Signs:  Ht 6' (1.829 m)   Wt 250 lb (113.4 kg)   BMI 33.91 kg/m    VITAL SIGNS:  reviewed GEN:  no acute distress  ASSESSMENT & PLAN:    Paroxysmal atrial fibrillation: Mr. Sermon reports 3-4 episodes of palpitations, shortness of breath, and chest tightness during his lifetime, leading to the diagnosis of paroxysmal atrial fibrillation at the time of ED visit in December.  He did not tolerate low-dose daily metoprolol and aspirin due to fatigue and easy bruising.  Given the relatively infrequent episodes, I think it is reasonable to defer a daily medication.  We will use a "pill in pocket" approach with metoprolol tartrate 12.5 mg to be taken if symptoms and repeated after 30 minutes if no improvement.  If palpitations persist, he should contact our office for further instructions.  There is no indication for anticoagulation, given a CHADSVASC score of 0.  In light of his young age of onset of atrial fibrillation and report of snoring, I will refer Mr. Moris for sleep evaluation to exclude obstructive sleep apnea, as this can be significant risk factor for atrial fibrillation.  Should he have more frequent/severe episodes of atrial fibrillation, I will refer him to electrophysiology to discuss other treatment options.  COVID-19 Education: The signs and symptoms of COVID-19 were discussed with the patient and how to seek care for testing (follow up with PCP or arrange E-visit).  The importance of social distancing was discussed today.  Time:   Today, I have spent 20 minutes with the patient with telehealth technology discussing  the above problems.     Medication Adjustments/Labs and Tests Ordered: Current medicines are reviewed at length with the patient today.  Concerns regarding medicines are outlined above.   Tests Ordered: Orders Placed This Encounter  Procedures  . Ambulatory referral to Pulmonology    Medication Changes: Meds ordered this encounter  Medications  . metoprolol tartrate (LOPRESSOR) 25 MG tablet    Sig: Take 12.5 mg (0.5 tablet) by mouth as needed for palpitations. May repeat dose 12.5 mg (0.5 tablet) if no improvement after 30 minutes.    Dispense:  30 tablet    Refill:  2    Follow Up:  In Person in 3 month(s)  Signed, Nelva Bush, MD  01/01/2019 8:47 PM    [facility]

## 2018-12-31 NOTE — Patient Instructions (Addendum)
Medication Instructions:  Your physician has recommended you make the following change in your medication:  1- TAKE Metoprolol tartrate 12.5 mg (0.5 tablet) by mouth as needed for palpitations. May repeat dose 12.5 mg (0.5 tablet) by mouth if no improvement after 30 minutes.  If you need a refill on your cardiac medications before your next appointment, please call your pharmacy.   Lab work: - None ordered.  If you have labs (blood work) drawn today and your tests are completely normal, you will receive your results only by: Marland Kitchen MyChart Message (if you have MyChart) OR . A paper copy in the mail If you have any lab test that is abnormal or we need to change your treatment, we will call you to review the results.  Testing/Procedures: - None ordered.   Follow-Up: You have been referred to Pulmonology for evaluation of sleep apnea.     At [facility], you and your health needs are our priority.  As part of our continuing mission to provide you with exceptional heart care, we have created designated Provider Care Teams.  These Care Teams include your primary Cardiologist (physician) and Advanced Practice Providers (APPs -  Physician Assistants and Nurse Practitioners) who all work together to provide you with the care you need, when you need it. You will need a follow up appointment in 3 months (scheduled).   You may see DR Harrell Gave END or one of the following Advanced Practice Providers on your designated Care Team:   Murray Hodgkins, NP Christell Faith, PA-C . Marrianne Mood, PA-C

## 2019-02-03 ENCOUNTER — Institutional Professional Consult (permissible substitution): Payer: 59 | Admitting: Internal Medicine

## 2019-02-22 ENCOUNTER — Ambulatory Visit (INDEPENDENT_AMBULATORY_CARE_PROVIDER_SITE_OTHER): Payer: 59 | Admitting: Internal Medicine

## 2019-02-22 ENCOUNTER — Encounter: Payer: Self-pay | Admitting: Internal Medicine

## 2019-02-22 ENCOUNTER — Other Ambulatory Visit: Payer: Self-pay

## 2019-02-22 DIAGNOSIS — G4719 Other hypersomnia: Secondary | ICD-10-CM | POA: Diagnosis not present

## 2019-02-22 DIAGNOSIS — F17210 Nicotine dependence, cigarettes, uncomplicated: Secondary | ICD-10-CM

## 2019-02-22 NOTE — Progress Notes (Signed)
Name: Cody Kramer MRN: [id number] DOB: 1990-07-30     I connected with the patient by telephone enabled telemedicine visit and verified that I am speaking with the correct person using two identifiers.    I discussed the limitations, risks, security and privacy concerns of performing an evaluation and management service by telemedicine and the availability of in-person appointments. I also discussed with the patient that there may be a patient responsible charge related to this service. The patient expressed understanding and agreed to proceed.  PATIENT AGREES AND CONFIRMS -YES   Other persons participating in the visit and their role in the encounter: Patient, nursing  This visit type was conducted due to national recommendations for restrictions regarding the COVID-19 Pandemic (e.g. social distancing).  This format is felt to be most appropriate for this patient at this time.  All issues noted in this document were discussed and addressed.       CONSULTATION DATE: 02/22/2019  REFERRING MD : End  CHIEF COMPLAINT: h/o palpitations Excessive daytime sleepiness   HISTORY OF PRESENT ILLNESS:  Patient is seen today for problems and issues with sleep related to excessive daytime sleepiness Patient  has been having sleep problems for many years Patient has been having excessive daytime sleepiness for a long time Patient has been having extreme fatigue and tiredness, lack of energy +  very Loud snoring every night + struggling breathe at night and gasps for air   Discussed sleep data and reviewed with patient.  Encouraged proper weight management.  Discussed driving precautions and its relationship with hypersomnolence.  Discussed operating dangerous equipment and its relationship with hypersomnolence.  Discussed sleep hygiene, and benefits of a fixed sleep waked time.  The importance of getting eight or more hours of sleep discussed with patient.  Discussed limiting the  use of the computer and television before bedtime.  Decrease naps during the day, so night time sleep will become enhanced.  Limit caffeine, and sleep deprivation.  HTN, stroke, and heart failure are potential risk factors.   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 I also advised patient on behavioral therapy and can provide oral medication therapy in conjunction with the other therapies  Follow up next Office visit  for assessment of smoking cessation  Smoking cessation counseling advised for 4 minutes    PAST MEDICAL HISTORY :   has a past medical history of Paroxysmal atrial fibrillation (HCC).  has no past surgical history on file. Prior to Admission medications   Medication Sig Start Date End Date Taking? Authorizing Provider  metoprolol tartrate (LOPRESSOR) 25 MG tablet Take 12.5 mg (0.5 tablet) by mouth as needed for palpitations. May repeat dose 12.5 mg (0.5 tablet) if no improvement after 30 minutes. 12/31/18   End, Cristal Deer, MD   Allergies  Allergen Reactions  . Amoxicillin Hives    FAMILY HISTORY:  family history includes Healthy in his father and mother. SOCIAL HISTORY:  reports that he has quit smoking. He uses smokeless tobacco. He reports current alcohol use.  REVIEW OF SYSTEMS:   Constitutional: Negative for fever, chills, weight loss, malaise/fatigue and diaphoresis.  HENT: Negative for hearing loss, ear pain, nosebleeds, congestion, sore throat, neck pain, tinnitus and ear discharge.   Eyes: Negative for blurred vision, double vision, photophobia, pain, discharge and redness.  Respiratory:  Negative for cough, hemoptysis, sputum production, shortness of breath, wheezing and stridor.   Cardiovascular: Negative for chest pain, palpitations, orthopnea, claudication, leg swelling and PND.  Gastrointestinal: Negative for heartburn, nausea, vomiting, abdominal pain, diarrhea, constipation, blood in stool and melena.  Genitourinary: Negative for dysuria, urgency, frequency, hematuria and flank pain.  Musculoskeletal: Negative for myalgias, back pain, joint pain and falls.  Skin: Negative for itching and rash.  Neurological: Negative for dizziness, tingling, tremors, sensory change, speech change, focal weakness, seizures, loss of consciousness, weakness and headaches.  Endo/Heme/Allergies: Negative for environmental allergies and polydipsia. Does not bruise/bleed easily.  All other ROS negative       ASSESSMENT AND PLAN SYNOPSIS  Signs and symptoms of sleep apnea with snoring excessive daytime sleepiness and gasping for air Patient needs definitive diagnosis Sleep study ordered  Follow-up after test completed  Recommend smoking cessation  COVID-19 EDUCATION: The signs and symptoms of COVID-19 were discussed with the patient and how to seek care for testing.  The importance of social distancing was discussed today. Hand Washing Techniques and avoid touching face was advised.     MEDICATION ADJUSTMENTS/LABS AND TESTS ORDERED: Sleep study ordered   CURRENT MEDICATIONS REVIEWED AT LENGTH WITH PATIENT TODAY   Patient satisfied with Plan of action and management. All questions answered  Follow up in 3 months  Total time spent 27 minutes  Corrin Parker, M.D.  [facility]  Medical Director [place] Director [facility] Cardio-Pulmonary Department

## 2019-02-22 NOTE — Patient Instructions (Signed)
Home sleep study ordered Please stop smoking

## 2019-04-02 ENCOUNTER — Ambulatory Visit: Payer: 59 | Admitting: Internal Medicine

## 2019-05-13 ENCOUNTER — Ambulatory Visit: Payer: 59 | Admitting: Internal Medicine

## 2019-05-19 ENCOUNTER — Other Ambulatory Visit: Payer: Self-pay

## 2019-05-19 ENCOUNTER — Encounter: Payer: Self-pay | Admitting: Internal Medicine

## 2019-05-19 ENCOUNTER — Ambulatory Visit (INDEPENDENT_AMBULATORY_CARE_PROVIDER_SITE_OTHER): Payer: 59 | Admitting: Internal Medicine

## 2019-05-19 VITALS — BP 130/90 | HR 81 | Ht 72.0 in | Wt 269.5 lb

## 2019-05-19 DIAGNOSIS — Z0000 Encounter for general adult medical examination without abnormal findings: Secondary | ICD-10-CM | POA: Diagnosis not present

## 2019-05-19 DIAGNOSIS — I480 Paroxysmal atrial fibrillation: Secondary | ICD-10-CM | POA: Diagnosis not present

## 2019-05-19 NOTE — Patient Instructions (Signed)
Medication Instructions:  Your physician recommends that you continue on your current medications as directed. Please refer to the Current Medication list given to you today.  *If you need a refill on your cardiac medications before your next appointment, please call your pharmacy*  Lab Work: none If you have labs (blood work) drawn today and your tests are completely normal, you will receive your results only by: Marland Kitchen MyChart Message (if you have MyChart) OR . A paper copy in the mail If you have any lab test that is abnormal or we need to change your treatment, we will call you to review the results.  Testing/Procedures: none  Follow-Up: At [facility], you and your health needs are our priority.  As part of our continuing mission to provide you with exceptional heart care, we have created designated Provider Care Teams.  These Care Teams include your primary Cardiologist (physician) and Advanced Practice Providers (APPs -  Physician Assistants and Nurse Practitioners) who all work together to provide you with the care you need, when you need it.  Your next appointment:   6 month(s)  The format for your next appointment:   In Person  Provider:    You may see DR Cristal Deer END or one of the following Advanced Practice Providers on your designated Care Team:    Nicolasa Ducking, NP  Eula Listen, PA-C  Marisue Ivan, PA-C   Other Instructions Call your Pulmonologist at [phone] to inquire on next steps for sleep study.  Referral for Primary Care placed. Please call number listed on referral to schedule.

## 2019-05-19 NOTE — Progress Notes (Signed)
Follow-up Outpatient Visit Date: 05/19/2019  Primary Care Provider: Patient, No Pcp Per No address on file  Chief Complaint: Follow-up atrial fibrillation  HPI:  Cody Kramer is a 29 y.o. male with history of paroxysmal atrial fibrillation, who presents for follow-up of atrial fibrillation.  I met him via virtual visit in August regarding a second opinion for management of atrial fibrillation.  He was previously followed by Dr. Saralyn Pilar.  Reported 3-4 episodes of palpitations, dyspnea, and chest tightness during his lifetime, leading to the diagnosis of paroxysmal atrial fibrillation during ED visit in 04/2018.  He was intolerant of standing low-dose metoprolol and aspirin due to fatigue and easy bruising.  We agreed to use metoprolol tartrate 12.5 mg as needed for palpitations.  He was seen by Dr. Mortimer Fries (pulmonary) in October and referred for sleep study to evaluate excessive daytime sleepiness.  Today, Cody Kramer reports that he has been feeling relatively well.  He notes minimal brief palpitations and only took metoprolol once due to vague uneasy feeling in his chest.  He continues to have fatigue and is waiting to hear back about scheduling sleep study.  He notes a few episodes of sporadic left upper chest pain that is sharp and lasts 5 seconds or less.  It is not related to any particular activity.  There are no associated symptoms.  He denies lightheadedness, syncope, edema, and shortness of breath.  He walks 5 miles a day without difficulty.  --------------------------------------------------------------------------------------------------  Past Medical History:  Diagnosis Date  . Paroxysmal atrial fibrillation ([place])    History reviewed. No pertinent surgical history.  Current Meds  Medication Sig  . metoprolol tartrate (LOPRESSOR) 25 MG tablet Take 12.5 mg (0.5 tablet) by mouth as needed for palpitations. May repeat dose 12.5 mg (0.5 tablet) if no improvement after 30 minutes.     Allergies: Coconut oil and Amoxicillin  Social History   Tobacco Use  . Smoking status: Former Research scientist (life sciences)  . Smokeless tobacco: Current User  Substance Use Topics  . Alcohol use: Yes  . Drug use: Not on file    Family History  Problem Relation Age of Onset  . Healthy Mother   . Healthy Father     Review of Systems: A 12-system review of systems was performed and was negative except as noted in the HPI.  --------------------------------------------------------------------------------------------------  Physical Exam: BP 130/90 (BP Location: Left Arm, Patient Position: Sitting, Cuff Size: Normal)   Pulse 81   Ht 6' (1.829 m)   Wt 269 lb 8 oz (122.2 kg)   BMI 36.55 kg/m   General: NAD. HEENT: No conjunctival pallor or scleral icterus. Facemask in place. Neck: Supple without lymphadenopathy, thyromegaly, JVD, or HJR. Lungs: Normal work of breathing. Clear to auscultation bilaterally without wheezes or crackles. Heart: Regular rate and rhythm without murmurs, rubs, or gallops. Non-displaced PMI. Abd: Bowel sounds present. Soft, NT/ND without hepatosplenomegaly Ext: No lower extremity edema. Radial, PT, and DP pulses are 2+ bilaterally. Skin: Warm and dry without rash.  EKG: Normal sinus rhythm without abnormality.  Lab Results  Component Value Date   WBC 9.0 12/24/2018   HGB 14.2 12/24/2018   HCT 41.1 12/24/2018   MCV 87.8 12/24/2018   PLT 191 12/24/2018    Lab Results  Component Value Date   NA 139 12/24/2018   K 3.6 12/24/2018   CL 107 12/24/2018   CO2 25 12/24/2018   BUN 22 (H) 12/24/2018   CREATININE 1.04 12/24/2018   GLUCOSE 102 (H) 12/24/2018  ALT 29 12/24/2018    No results found for: CHOL, HDL, LDLCALC, LDLDIRECT, TRIG, CHOLHDL  --------------------------------------------------------------------------------------------------  ASSESSMENT AND PLAN: Paroxysmal atrial fibrillation: Minimal palpitations noted since our last visit.  Given young  age for PAF and fatigue, I agree with proceeding with sleep study; we will reach out to the pulmonary office to help facilitate scheduling this.  Given CHA2DS2-VASc score of 0, it is reasonable to defer anticoagulation.  We will continue as needed metoprolol, given that Cody Kramer did not tolerate standing metoprolol well in the past.  Healthcare maintenance: I have suggested to Cody Kramer that he obtain a PCP for routine general health care.  We will refer him to [facility].  Follow-up: Return to clinic in 6 months.  Nelva Bush, MD 05/20/2019 7:38 AM

## 2019-05-20 ENCOUNTER — Encounter: Payer: Self-pay | Admitting: Internal Medicine

## 2019-05-20 DIAGNOSIS — I480 Paroxysmal atrial fibrillation: Secondary | ICD-10-CM | POA: Insufficient documentation

## 2019-05-21 ENCOUNTER — Telehealth: Payer: Self-pay | Admitting: General Practice

## 2019-05-21 NOTE — Telephone Encounter (Signed)
Dr. Okey Dupre from [facility] referred patient to Dr. Birdie Sons to establish primary care. Dr. Birdie Sons agreed to accept him as a new patient for the first available appointment that he has. Called pt to schedule. He has to check his work schedule and will give [place] a call back Monday to schedule.

## 2019-05-24 ENCOUNTER — Telehealth: Payer: Self-pay

## 2019-05-24 DIAGNOSIS — G4719 Other hypersomnia: Secondary | ICD-10-CM

## 2019-05-24 NOTE — Telephone Encounter (Signed)
-----   Message from Stann Mainland, RN sent at 05/21/2019  1:58 PM EST ----- Regarding: Sleep study Hi,  Dr End saw this patient last week and patient said he had never done his sleep study. I advised him to call your office. He saw Dr Belia Heman back in October.  Please follow up with patient regarding sleep study when able. Thanks, Onnie Boer, RN

## 2019-05-24 NOTE — Telephone Encounter (Signed)
Per research it does not appear that HST was ordered during last office visit.  HST has been placed.  Pt is aware of below message and voiced his understanding. Advised pt to contact our office in one week if he has not been contacted to schedule study. Nothing further is needed.

## 2019-07-06 ENCOUNTER — Ambulatory Visit: Payer: 59

## 2019-07-06 ENCOUNTER — Other Ambulatory Visit: Payer: Self-pay

## 2019-07-06 DIAGNOSIS — G4719 Other hypersomnia: Secondary | ICD-10-CM

## 2019-07-16 ENCOUNTER — Encounter: Payer: Self-pay | Admitting: Family Medicine

## 2019-07-16 ENCOUNTER — Ambulatory Visit: Payer: 59 | Admitting: Adult Health

## 2019-07-16 ENCOUNTER — Other Ambulatory Visit: Payer: Self-pay

## 2019-07-16 ENCOUNTER — Ambulatory Visit (INDEPENDENT_AMBULATORY_CARE_PROVIDER_SITE_OTHER): Payer: 59 | Admitting: Family Medicine

## 2019-07-16 VITALS — BP 120/80 | HR 65 | Temp 97.1°F | Ht 72.0 in | Wt 247.2 lb

## 2019-07-16 DIAGNOSIS — R3589 Other polyuria: Secondary | ICD-10-CM

## 2019-07-16 DIAGNOSIS — Z79890 Hormone replacement therapy (postmenopausal): Secondary | ICD-10-CM

## 2019-07-16 DIAGNOSIS — G4710 Hypersomnia, unspecified: Secondary | ICD-10-CM

## 2019-07-16 DIAGNOSIS — Z87891 Personal history of nicotine dependence: Secondary | ICD-10-CM

## 2019-07-16 DIAGNOSIS — I480 Paroxysmal atrial fibrillation: Secondary | ICD-10-CM

## 2019-07-16 DIAGNOSIS — R358 Other polyuria: Secondary | ICD-10-CM

## 2019-07-16 DIAGNOSIS — Z13220 Encounter for screening for lipoid disorders: Secondary | ICD-10-CM | POA: Diagnosis not present

## 2019-07-16 DIAGNOSIS — Z114 Encounter for screening for human immunodeficiency virus [HIV]: Secondary | ICD-10-CM

## 2019-07-16 NOTE — Patient Instructions (Addendum)
Nice to meet you. We will have you return for labs.  Please proceed with your sleep study.

## 2019-07-18 ENCOUNTER — Encounter: Payer: Self-pay | Admitting: Family Medicine

## 2019-07-18 DIAGNOSIS — T387X5A Adverse effect of androgens and anabolic congeners, initial encounter: Secondary | ICD-10-CM | POA: Insufficient documentation

## 2019-07-18 DIAGNOSIS — R3589 Other polyuria: Secondary | ICD-10-CM | POA: Insufficient documentation

## 2019-07-18 DIAGNOSIS — R358 Other polyuria: Secondary | ICD-10-CM | POA: Insufficient documentation

## 2019-07-18 DIAGNOSIS — Z79890 Hormone replacement therapy (postmenopausal): Secondary | ICD-10-CM | POA: Insufficient documentation

## 2019-07-18 DIAGNOSIS — G4710 Hypersomnia, unspecified: Secondary | ICD-10-CM | POA: Insufficient documentation

## 2019-07-18 DIAGNOSIS — Z87891 Personal history of nicotine dependence: Secondary | ICD-10-CM | POA: Insufficient documentation

## 2019-07-18 NOTE — Assessment & Plan Note (Signed)
Congratulated on smoking cessation.  Encouraged to continue to abstain from smoking.

## 2019-07-18 NOTE — Progress Notes (Signed)
Cody Rumps, MD Phone: [phone]  Cody Kramer is a 29 y.o. male who presents today for new patient visit.  Atrial fibrillation: Patient is following with cardiology.  He was diagnosed with A. fib in 2019.  He initially saw [facility] cardiology though he had difficulty tolerating the metoprolol due to fatigue as well as aspirin due to bruising.  He recently saw [facility] health cardiology and they recommended as needed metoprolol.  The patient's CHA2DS2-VASc score is 0.  Hypersomnia: Patient is thought to have possible sleep apnea.  He is scheduled for a sleep study this weekend.  He does not wake well rested.  He is tired a lot.  He does report some apnea.  Frequent urination: Patient notes this is increased as he is aged.  He does drink about a gallon of water a day.  Does report a family history of diabetes on his mother side.  History of tobacco abuse: Patient reports he quit smoking about 4 months ago.  He is smoking about half a pack per day.  He feels significantly better and is breathing better.  History of testosterone use: Patient notes he previously used testosterone as a supplement on 3 occasions.  He wonders about checking testosterone levels to ensure that he had no long-term issues from his prior testosterone use.  Obesity: Patient has made some lifestyle changes and is running for exercise and does mixed martial arts.  Active Ambulatory Problems    Diagnosis Date Noted  . Paroxysmal atrial fibrillation ([place]) 05/20/2019  . Hypersomnia 07/18/2019  . Polyuria 07/18/2019  . Long-term current use of testosterone cypionate 07/18/2019  . History of tobacco abuse 07/18/2019   Resolved Ambulatory Problems    Diagnosis Date Noted  . No Resolved Ambulatory Problems   Past Medical History:  Diagnosis Date  . Asthma     Family History  Problem Relation Age of Onset  . Healthy Mother   . Heart disease Mother   . Healthy Father   . COPD Maternal Grandfather      Social History   Socioeconomic History  . Marital status: Single    Spouse name: Not on file  . Number of children: Not on file  . Years of education: Not on file  . Highest education level: Not on file  Occupational History  . Not on file  Tobacco Use  . Smoking status: Former Research scientist (life sciences)  . Smokeless tobacco: Current User  Substance and Sexual Activity  . Alcohol use: Yes  . Drug use: Not on file  . Sexual activity: Not on file  Other Topics Concern  . Not on file  Social History Narrative  . Not on file   Social Determinants of Health   Financial Resource Strain:   . Difficulty of Paying Living Expenses: Not on file  Food Insecurity:   . Worried About Charity fundraiser in the Last Year: Not on file  . Ran Out of Food in the Last Year: Not on file  Transportation Needs:   . Lack of Transportation (Medical): Not on file  . Lack of Transportation (Non-Medical): Not on file  Physical Activity:   . Days of Exercise per Week: Not on file  . Minutes of Exercise per Session: Not on file  Stress:   . Feeling of Stress : Not on file  Social Connections:   . Frequency of Communication with Friends and Family: Not on file  . Frequency of Social Gatherings with Friends and Family: Not on file  .  Attends Religious Services: Not on file  . Active Member of Clubs or Organizations: Not on file  . Attends Archivist Meetings: Not on file  . Marital Status: Not on file  Intimate Partner Violence:   . Fear of Current or Ex-Partner: Not on file  . Emotionally Abused: Not on file  . Physically Abused: Not on file  . Sexually Abused: Not on file    ROS  General:  Negative for nexplained weight loss, fever Skin: Negative for new or changing mole, sore that won't heal HEENT: Negative for trouble hearing, trouble seeing, ringing in ears, mouth sores, hoarseness, change in voice, dysphagia. CV:  Negative for chest pain, dyspnea, edema, palpitations Resp: Negative for  cough, dyspnea, hemoptysis GI: Negative for nausea, vomiting, diarrhea, constipation, abdominal pain, melena, hematochezia. GU: Positive for frequent urination.  Negative for dysuria, incontinence, urinary hesitance, hematuria, vaginal or penile discharge, sexual difficulty, lumps in testicle or breasts MSK: Negative for muscle cramps or aches, joint pain or swelling Neuro: Negative for headaches, weakness, numbness, dizziness, passing out/fainting Psych: Negative for depression, anxiety, memory problems  Objective  Physical Exam Vitals:   07/16/19 1451  BP: 120/80  Pulse: 65  Temp: (!) 97.1 F (36.2 C)  SpO2: 97%    BP Readings from Last 3 Encounters:  07/16/19 120/80  05/19/19 130/90  12/24/18 129/90   Wt Readings from Last 3 Encounters:  07/16/19 247 lb 3.2 oz (112.1 kg)  05/19/19 269 lb 8 oz (122.2 kg)  12/31/18 250 lb (113.4 kg)    Physical Exam Constitutional:      General: He is not in acute distress.    Appearance: He is not diaphoretic.  HENT:     Head: Normocephalic and atraumatic.  Eyes:     Conjunctiva/sclera: Conjunctivae normal.     Pupils: Pupils are equal, round, and reactive to light.  Cardiovascular:     Rate and Rhythm: Normal rate and regular rhythm.     Heart sounds: Normal heart sounds.  Pulmonary:     Effort: Pulmonary effort is normal.     Breath sounds: Normal breath sounds.  Abdominal:     General: Bowel sounds are normal. There is no distension.     Palpations: Abdomen is soft.     Tenderness: There is no abdominal tenderness. There is no guarding or rebound.  Musculoskeletal:     Right lower leg: No edema.     Left lower leg: No edema.  Skin:    General: Skin is warm and dry.  Neurological:     Mental Status: He is alert.  Psychiatric:        Mood and Affect: Mood normal.      Assessment/Plan:   Paroxysmal atrial fibrillation (HCC) Asymptomatic.  He will continue as needed metoprolol.  Discussed given his CHA2DS2-VASc score  it is reasonable for him to not be on any anticoagulation though he will likely require it at some point in the future.  Also discussed that if he does have sleep apnea getting that under control could resolve the atrial fibrillation.  He will continue to follow with cardiology as well.  Hypersomnia Suspected sleep apnea.  He will proceed with sleep study.  Discussed weight loss could contribute to improving sleep apnea though he would likely need a CPAP for a period of time if he tests positive.  Long-term current use of testosterone cypionate Patient with history of testosterone use.  Given prior supplementation for nonmedical reasons I think it is  reasonable to test his testosterone level now that he is off of supplementation.  Polyuria Discussed potential causes.  Check lab work.  Urinalysis to be completed as well.  History of tobacco abuse Congratulated on smoking cessation.  Encouraged to continue to abstain from smoking.   Orders Placed This Encounter  Procedures  . Comp Met (CMET)    Standing Status:   Future    Standing Expiration Date:   07/15/2020  . TSH    Standing Status:   Future    Standing Expiration Date:   07/15/2020  . HgB A1c    Standing Status:   Future    Standing Expiration Date:   07/15/2020  . Testosterone    Standing Status:   Future    Standing Expiration Date:   07/15/2020  . HIV antibody (with reflex)    Standing Status:   Future    Standing Expiration Date:   07/15/2020  . POCT Urinalysis Dipstick    No orders of the defined types were placed in this encounter.  Health maintenance: He reports tetanus vaccine 1 to 2 years ago in the ED when he was there for his atrial fibrillation.  He is willing to undergo HIV screening.  This visit occurred during the SARS-CoV-2 public health emergency.  Safety protocols were in place, including screening questions prior to the visit, additional usage of staff PPE, and extensive cleaning of exam room while observing  appropriate contact time as indicated for disinfecting solutions.    Cody Rumps, MD [place]

## 2019-07-18 NOTE — Assessment & Plan Note (Signed)
Patient with history of testosterone use.  Given prior supplementation for nonmedical reasons I think it is reasonable to test his testosterone level now that he is off of supplementation.

## 2019-07-18 NOTE — Assessment & Plan Note (Addendum)
Asymptomatic.  He will continue as needed metoprolol.  Discussed given his CHA2DS2-VASc score it is reasonable for him to not be on any anticoagulation though he will likely require it at some point in the future.  Also discussed that if he does have sleep apnea getting that under control could resolve the atrial fibrillation.  He will continue to follow with cardiology as well.

## 2019-07-18 NOTE — Assessment & Plan Note (Addendum)
Suspected sleep apnea.  He will proceed with sleep study.  Discussed weight loss could contribute to improving sleep apnea though he would likely need a CPAP for a period of time if he tests positive.

## 2019-07-18 NOTE — Assessment & Plan Note (Signed)
Discussed potential causes.  Check lab work.  Urinalysis to be completed as well.

## 2019-07-20 ENCOUNTER — Other Ambulatory Visit: Payer: Self-pay

## 2019-07-20 ENCOUNTER — Other Ambulatory Visit (INDEPENDENT_AMBULATORY_CARE_PROVIDER_SITE_OTHER): Payer: 59

## 2019-07-20 DIAGNOSIS — R358 Other polyuria: Secondary | ICD-10-CM

## 2019-07-20 DIAGNOSIS — R3589 Other polyuria: Secondary | ICD-10-CM

## 2019-07-20 LAB — POCT URINALYSIS DIPSTICK
Bilirubin, UA: NEGATIVE
Blood, UA: NEGATIVE
Glucose, UA: NEGATIVE
Ketones, UA: NEGATIVE
Leukocytes, UA: NEGATIVE
Nitrite, UA: NEGATIVE
Protein, UA: NEGATIVE
Spec Grav, UA: >=1.03 — AB (ref 1.010–1.025)
Urobilinogen, UA: 0.2 E.U./dL
pH, UA: 5 (ref 5.0–8.0)

## 2019-07-27 ENCOUNTER — Other Ambulatory Visit: Payer: Self-pay

## 2019-07-28 DIAGNOSIS — G4733 Obstructive sleep apnea (adult) (pediatric): Secondary | ICD-10-CM

## 2019-08-03 ENCOUNTER — Telehealth: Payer: Self-pay | Admitting: Pulmonary Disease

## 2019-08-03 DIAGNOSIS — G4733 Obstructive sleep apnea (adult) (pediatric): Secondary | ICD-10-CM

## 2019-08-03 NOTE — Telephone Encounter (Signed)
Home sleep study shows mild OSA with AHI of 6.7 and SpO2 low of 85%.

## 2019-08-03 NOTE — Telephone Encounter (Signed)
Thank you :)

## 2019-11-26 ENCOUNTER — Ambulatory Visit: Payer: 59 | Admitting: Internal Medicine

## 2019-11-26 NOTE — Progress Notes (Deleted)
   Follow-up Outpatient Visit Date: 11/26/2019  Primary Care Provider: Glori Luis, MD [address] [place] [address]  Chief Complaint: ***  HPI:  Cody Kramer is a 29 y.o. male with history of paroxysmal atrial fibrillation, who presents for follow-up of atrial fibrillation.  I last saw him in January, at which time he reported minimal brief palpitations.  He only took as needed metoprolol once.  He noted continued fatigue and was awaiting sleep study.  This ultimately showed mild sleep apnea.  --------------------------------------------------------------------------------------------------  Past Medical History:  Diagnosis Date  . Asthma   . Paroxysmal atrial fibrillation [facility])    Past Surgical History:  Procedure Laterality Date  . TONSILLECTOMY      No outpatient medications have been marked as taking for the 11/26/19 encounter (Appointment) with Marzelle Rutten, Cristal Deer, MD.    Allergies: Coconut oil and Amoxicillin  Social History   Tobacco Use  . Smoking status: Former Games developer  . Smokeless tobacco: Current User  Vaping Use  . Vaping Use: Every day  Substance Use Topics  . Alcohol use: Not Currently  . Drug use: Never    Family History  Problem Relation Age of Onset  . Healthy Mother   . Heart disease Mother   . Healthy Father   . COPD Maternal Grandfather     Review of Systems: A 12-system review of systems was performed and was negative except as noted in the HPI.  --------------------------------------------------------------------------------------------------  Physical Exam: There were no vitals taken for this visit.  General:  *** HEENT: No conjunctival pallor or scleral icterus. Facemask in place. Neck: Supple without lymphadenopathy, thyromegaly, JVD, or HJR. Lungs: Normal work of breathing. Clear to auscultation bilaterally without wheezes or crackles. Heart: Regular rate and rhythm without murmurs, rubs, or gallops. Non-displaced  PMI. Abd: Bowel sounds present. Soft, NT/ND without hepatosplenomegaly Ext: No lower extremity edema. Radial, PT, and DP pulses are 2+ bilaterally. Skin: Warm and dry without rash.  EKG:  ***  Lab Results  Component Value Date   WBC 9.0 12/24/2018   HGB 14.2 12/24/2018   HCT 41.1 12/24/2018   MCV 87.8 12/24/2018   PLT 191 12/24/2018    Lab Results  Component Value Date   NA 139 12/24/2018   K 3.6 12/24/2018   CL 107 12/24/2018   CO2 25 12/24/2018   BUN 22 (H) 12/24/2018   CREATININE 1.04 12/24/2018   GLUCOSE 102 (H) 12/24/2018   ALT 29 12/24/2018    No results found for: CHOL, HDL, LDLCALC, LDLDIRECT, TRIG, CHOLHDL  --------------------------------------------------------------------------------------------------  ASSESSMENT AND PLAN: ***  Yvonne Kendall, MD 11/26/2019 7:45 AM

## 2019-11-29 ENCOUNTER — Encounter: Payer: Self-pay | Admitting: Internal Medicine

## 2020-03-02 IMAGING — CR DG CHEST 2V
1 series · 2 of 2 positions shown · non-contrast
Comparison: 12/28/2014.

CLINICAL DATA: Chest pain.

EXAM:
CHEST - 2 VIEW

[Series 1: dg chest 2 view · 0.14mm/px · 2 of 2 slices shown]
[im 1/2]
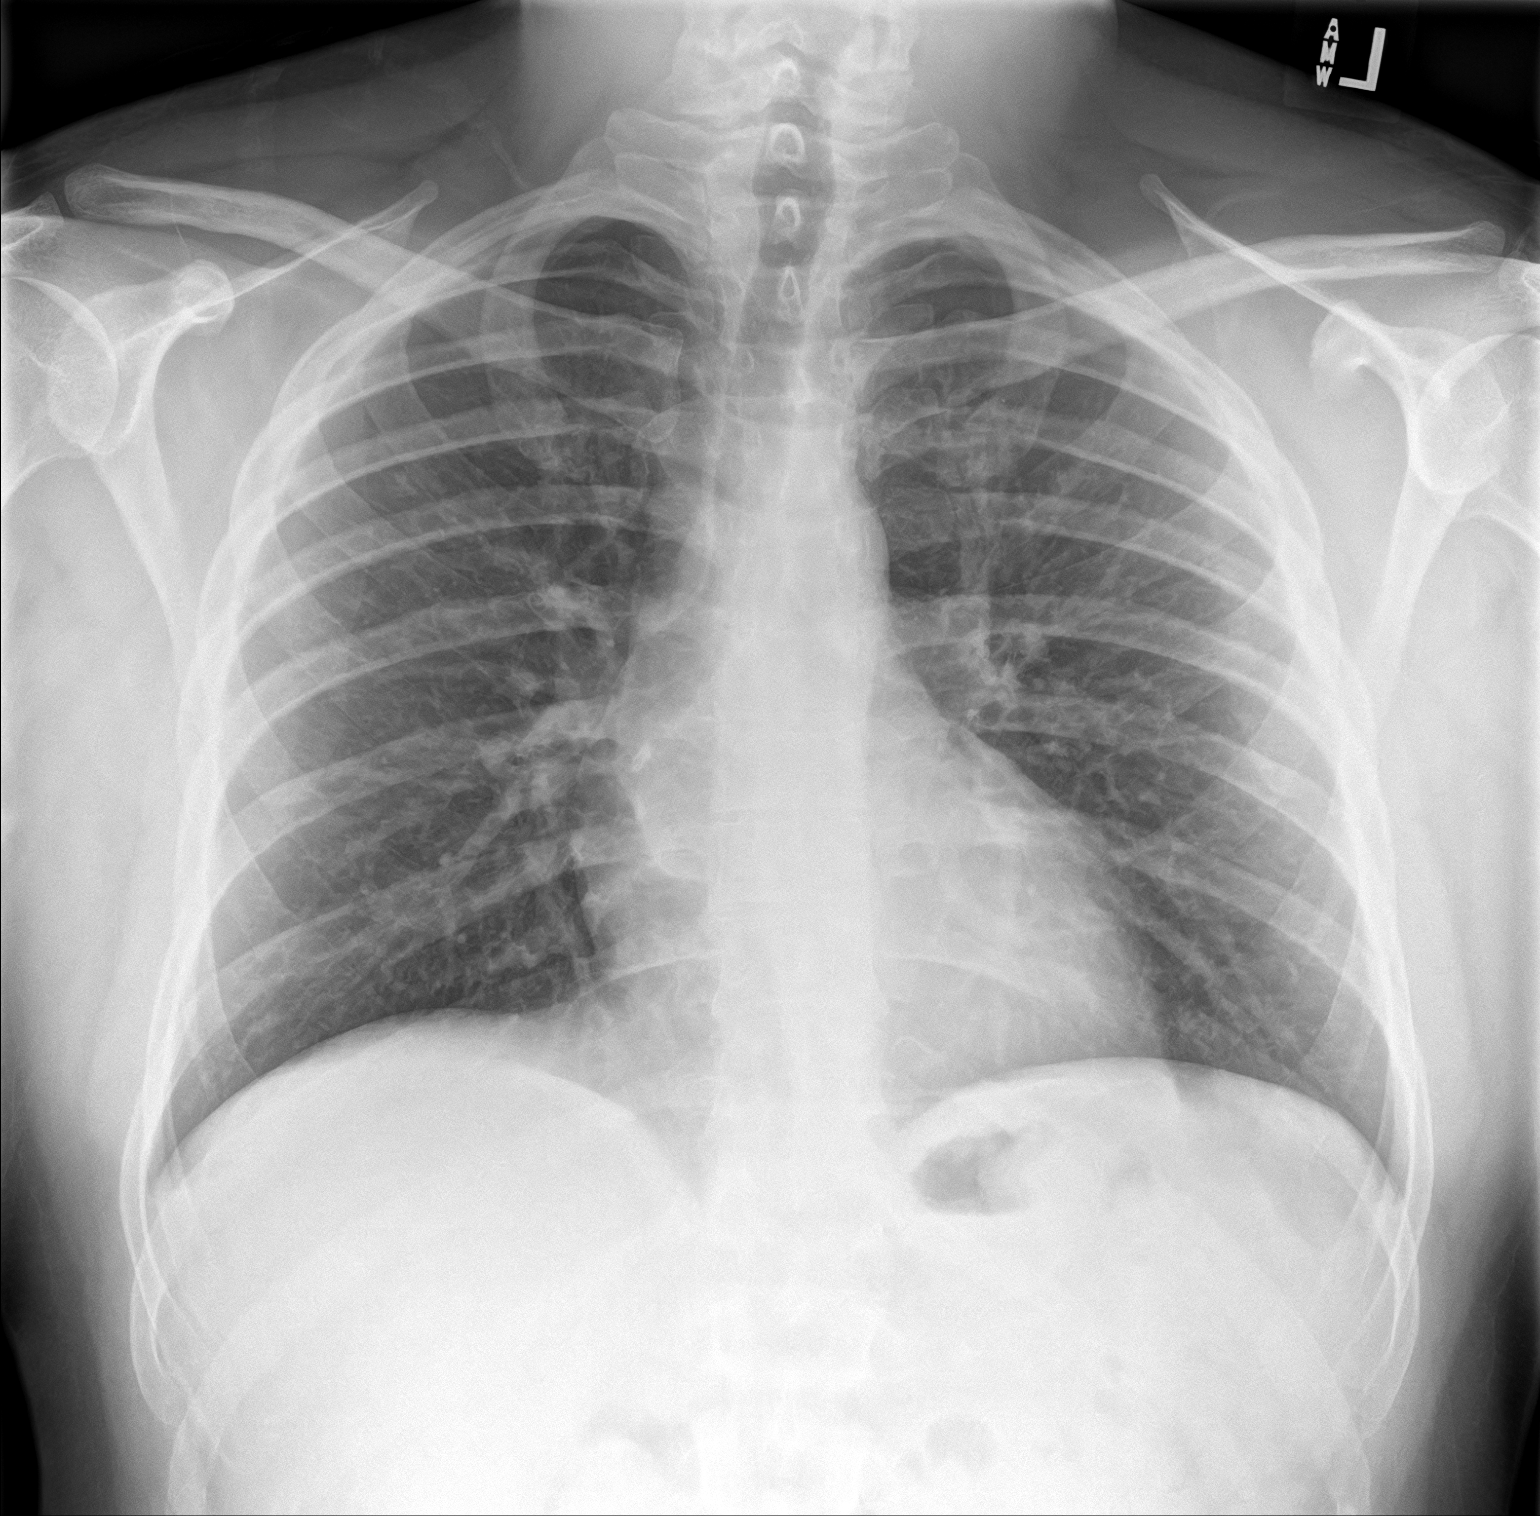
[im 2/2]
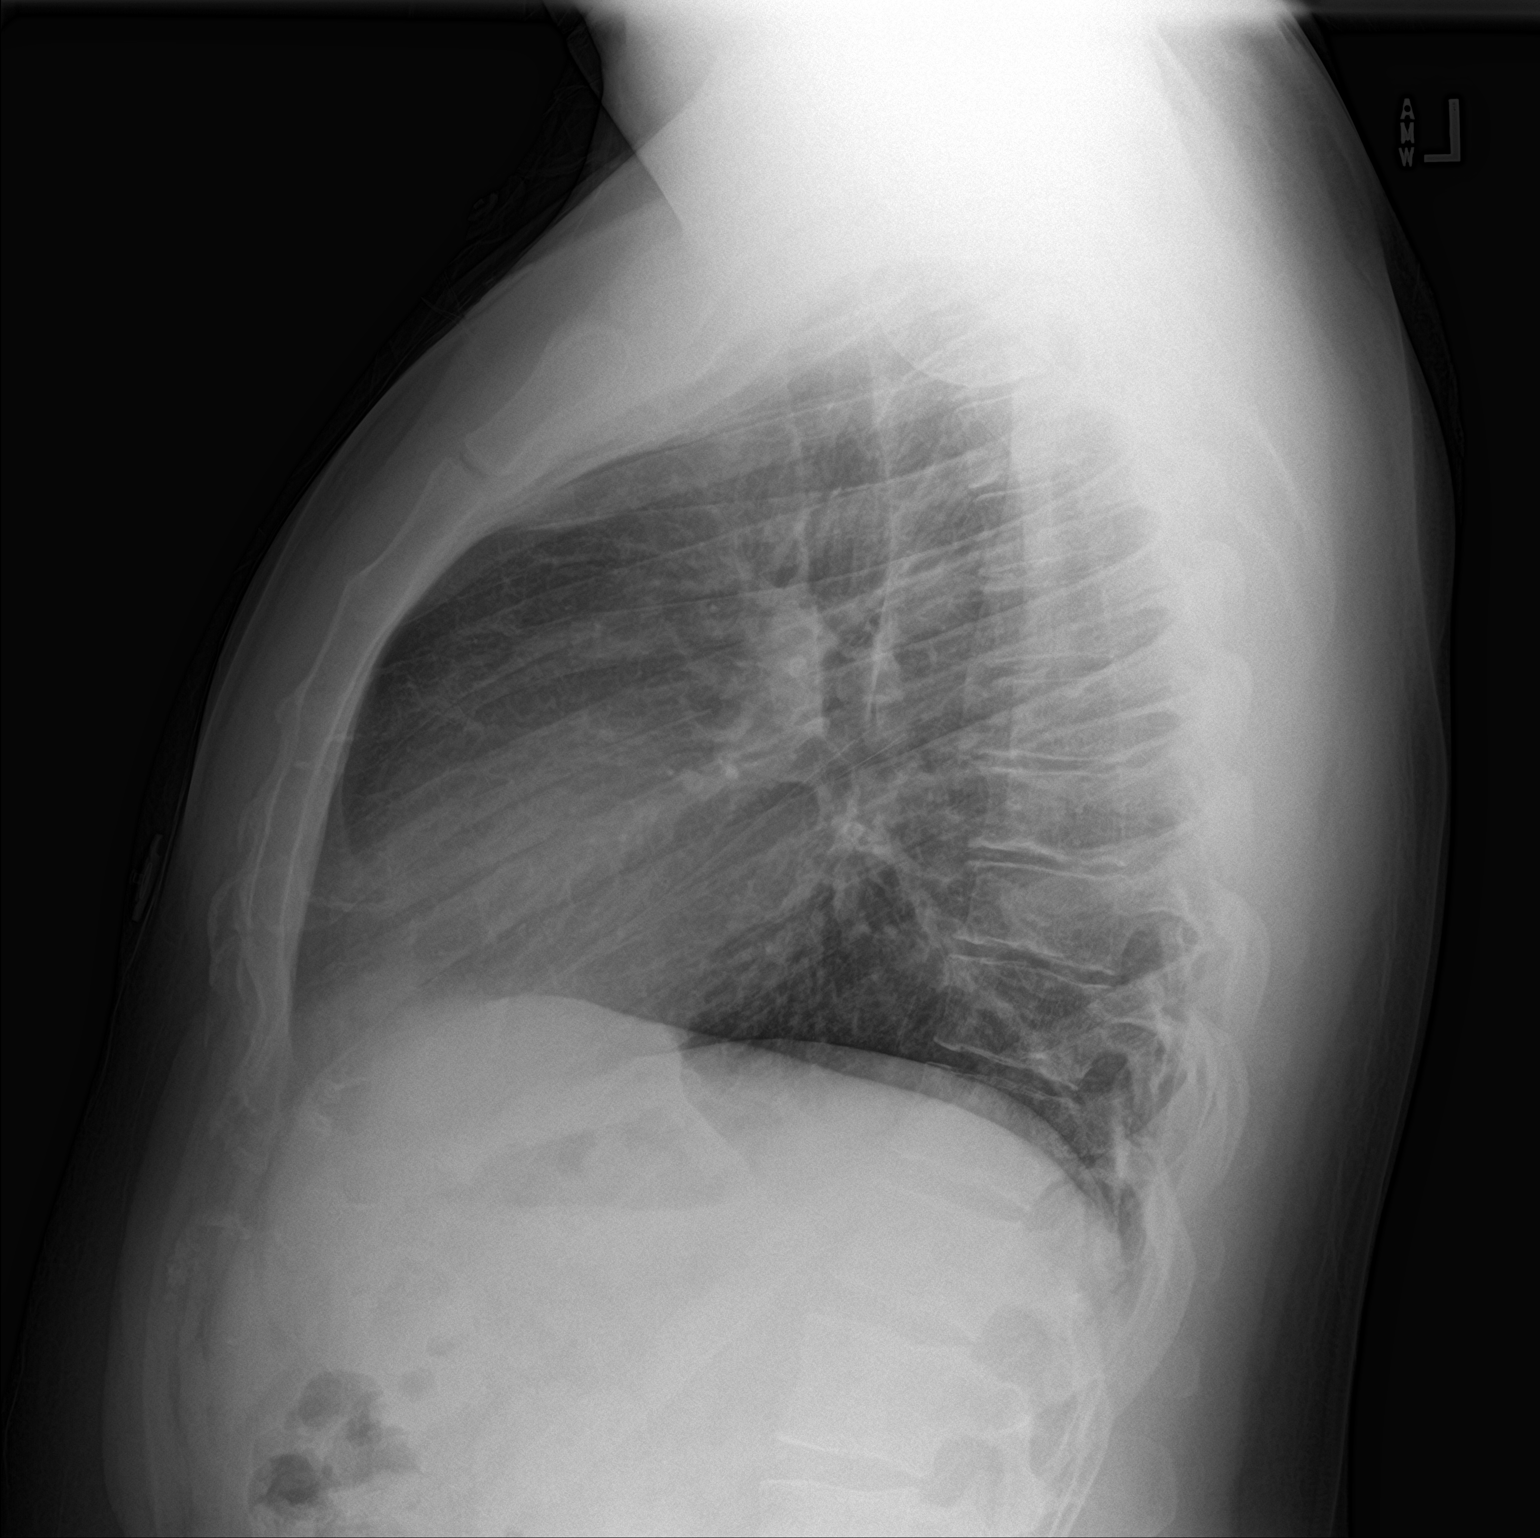

[2 of 2 positions shown; findings below may reference images not displayed]

FINDINGS: Mediastinum and hilar structures normal. Small density noted the
left upper lung. Lungs are clear. No pleural effusion or
pneumothorax. Heart size normal. Degenerative scratched it mild
thoracic spine scoliosis.
IMPRESSION: Small density noted in the left upper lung. This may represent a
small area of atelectasis. Follow-up PA and lateral chest x-ray to
demonstrate clearing is suggested. If this density persists
nonenhanced chest CT suggested to exclude a pulmonary nodule. Exam
otherwise unremarkable.

## 2021-07-12 ENCOUNTER — Telehealth: Payer: BC Managed Care – PPO | Admitting: Physician Assistant

## 2021-07-12 DIAGNOSIS — Z20818 Contact with and (suspected) exposure to other bacterial communicable diseases: Secondary | ICD-10-CM

## 2021-07-12 DIAGNOSIS — J069 Acute upper respiratory infection, unspecified: Secondary | ICD-10-CM

## 2021-07-12 MED ORDER — AZITHROMYCIN 250 MG PO TABS: ORAL_TABLET | ORAL | 0 refills | Status: AC

## 2021-07-12 MED ORDER — PREDNISONE 10 MG PO TABS: 30.0000 mg | ORAL_TABLET | Freq: Every day | ORAL | 0 refills | Status: AC

## 2021-07-12 NOTE — Patient Instructions (Signed)
?  [name], thank you for joining Piedad Climes, PA-C for today's virtual visit.  While this provider is not your primary care provider (PCP), if your PCP is located in our provider database this encounter information will be shared with them immediately following your visit. ? ?Consent: ?(Patient) Cody Kramer provided verbal consent for this virtual visit at the beginning of the encounter. ? ?Current Medications: ?No current outpatient medications on file.  ? ?Medications ordered in this encounter:  ?No orders of the defined types were placed in this encounter. ?  ? ?*If you need refills on other medications prior to your next appointment, please contact your pharmacy* ? ?Follow-Up: ?Call back or seek an in-person evaluation if the symptoms worsen or if the condition fails to improve as anticipated. ? ?Other Instructions ?Please keep hydrated and get plenty of rest. ?Start a saline nasal rinse and salt-water gargles. ?If you have a humidifier, place in the bedroom and run at night. ?You can alternate Tylenol and Ibuprofen as needed. ?Take the antibiotic and steroid as directed. ? ?If not resolving or any new/worsening symptoms despite treatment, you will need an in-person evaluation.  ? ? ?If you have been instructed to have an in-person evaluation today at a local Urgent Care facility, please use the link below. It will take you to a list of all of our available [facility], including address, phone number and hours of operation. Please do not delay care.  ?[facility] ? ?If you or a family member do not have a primary care provider, use the link below to schedule a visit and establish care. When you choose a [facility] primary care physician or advanced practice provider, you gain a long-term partner in health. ?Find a Primary Care Provider ? ?Learn more about [facility]'s in-office and virtual care options: ?[facility] - Get Care Now  ?

## 2021-07-12 NOTE — Progress Notes (Signed)
?Virtual Visit Consent  ? ?Cody Kramer, you are scheduled for a virtual visit with a [facility] provider today.   ?  ?Just as with appointments in the office, your consent must be obtained to participate.  Your consent will be active for this visit and any virtual visit you may have with one of our providers in the next 365 days.   ?  ?If you have a MyChart account, a copy of this consent can be sent to you electronically.  All virtual visits are billed to your insurance company just like a traditional visit in the office.   ? ?As this is a virtual visit, video technology does not allow for your provider to perform a traditional examination.  This may limit your provider's ability to fully assess your condition.  If your provider identifies any concerns that need to be evaluated in person or the need to arrange testing (such as labs, EKG, etc.), we will make arrangements to do so.   ?  ?Although advances in technology are sophisticated, we cannot ensure that it will always work on either your end or our end.  If the connection with a video visit is poor, the visit may have to be switched to a telephone visit.  With either a video or telephone visit, we are not always able to ensure that we have a secure connection.    ? ?I need to obtain your verbal consent now.   Are you willing to proceed with your visit today?  ?  ?Cody Kramer has provided verbal consent on 07/12/2021 for a virtual visit (video or telephone). ?  ?Cody Climes, PA-C  ? ?Date: 07/12/2021 7:56 AM ? ? ?Virtual Visit via Video Note  ? ?IPiedad Kramer, connected with  Cody Kramer  ([id number], Mar 19, 1991) on 07/12/21 at  7:45 AM EST by a video-enabled telemedicine application and verified that I am speaking with the correct person using two identifiers. ? ?Location: ?Patient: Virtual Visit Location Patient: Mobile - Parked Car ?Provider: Virtual Visit Location Provider: Home Office ?  ?I discussed the limitations of  evaluation and management by telemedicine and the availability of in person appointments. The patient expressed understanding and agreed to proceed.   ? ?History of Present Illness: ?Cody Kramer is a 31 y.o. who identifies as a male who was assigned male at birth, and is being seen today for possible strep. Notes wife just finished treatment for strep throat. In the past few days has had a progressively worsening sore throat with tonsillar swelling, subjective fever and now aches. Has had a slight dry cough and sinus pressure but no other URI symptoms. Denies recent travel or other known sick contact. ? ?HPI: HPI  ?Problems:  ?Patient Active Problem List  ? Diagnosis Date Noted  ? 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 Polyuria 07/18/2019  ? Long-term current use of testosterone cypionate 07/18/2019  ? History of tobacco abuse 07/18/2019  ? Paroxysmal atrial fibrillation (HCC) 05/20/2019  ?  ?Allergies:  ?Allergies  ?Allergen Reactions  ? Coconut Oil Swelling  ? Amoxicillin Hives and Other (See Comments)  ?  Unsure of reaction, "was a long time ago." ?Unsure of reaction, "was a long time ago." ?  ? ?Medications: No current outpatient medications on file. ? ?Observations/Objective: ?Patient is well-developed, well-nourished in no acute distress.  ?Resting comfortably in parked car.  ?Head is normocephalic, atraumatic.  ?No labored breathing. ?Speech is clear and coherent with logical content.  ?Patient is  alert and oriented at baseline.  ? ?Assessment and Plan: ?1. Streptococcus exposure ? ?2. Upper respiratory tract infection, unspecified type ? ?With mild nasal congestion/pressure and cough discussed could have a viral deal going on but giving known exposure to strep throat and progressively worsening throat symptoms will treat as such. Is penicillin-allergic (childhood reaction) so will start Azithromycin. Prednisone 30 mg given for 3 days to help with tonsillar swelling. Supportive measures and OTC medications  reviewed with patient.  ? ?Follow Up Instructions: ?I discussed the assessment and treatment plan with the patient. The patient was provided an opportunity to ask questions and all were answered. The patient agreed with the plan and demonstrated an understanding of the instructions.  A copy of instructions were sent to the patient via MyChart unless otherwise noted below.  ? ?The patient was advised to call back or seek an in-person evaluation if the symptoms worsen or if the condition fails to improve as anticipated. ? ?Time:  ?I spent 10 minutes with the patient via telehealth technology discussing the above problems/concerns.   ? ?Cody Climes, PA-C ?

## 2022-04-24 DIAGNOSIS — M778 Other enthesopathies, not elsewhere classified: Secondary | ICD-10-CM | POA: Insufficient documentation

## 2022-04-24 DIAGNOSIS — M7582 Other shoulder lesions, left shoulder: Secondary | ICD-10-CM | POA: Insufficient documentation

## 2022-06-04 ENCOUNTER — Ambulatory Visit (INDEPENDENT_AMBULATORY_CARE_PROVIDER_SITE_OTHER): Payer: BC Managed Care – PPO | Admitting: Family Medicine

## 2022-06-04 ENCOUNTER — Encounter: Payer: Self-pay | Admitting: Family Medicine

## 2022-06-04 VITALS — BP 130/80 | HR 79 | Temp 97.9°F | Ht 72.0 in | Wt 289.0 lb

## 2022-06-04 DIAGNOSIS — Z79890 Hormone replacement therapy (postmenopausal): Secondary | ICD-10-CM | POA: Diagnosis not present

## 2022-06-04 DIAGNOSIS — I480 Paroxysmal atrial fibrillation: Secondary | ICD-10-CM

## 2022-06-04 DIAGNOSIS — E6609 Other obesity due to excess calories: Secondary | ICD-10-CM | POA: Diagnosis not present

## 2022-06-04 DIAGNOSIS — E669 Obesity, unspecified: Secondary | ICD-10-CM | POA: Insufficient documentation

## 2022-06-04 DIAGNOSIS — Z6839 Body mass index (BMI) 39.0-39.9, adult: Secondary | ICD-10-CM

## 2022-06-04 DIAGNOSIS — Z0001 Encounter for general adult medical examination with abnormal findings: Secondary | ICD-10-CM | POA: Insufficient documentation

## 2022-06-04 DIAGNOSIS — Z0000 Encounter for general adult medical examination without abnormal findings: Secondary | ICD-10-CM

## 2022-06-04 DIAGNOSIS — Z13220 Encounter for screening for lipoid disorders: Secondary | ICD-10-CM

## 2022-06-04 DIAGNOSIS — T387X5A Adverse effect of androgens and anabolic congeners, initial encounter: Secondary | ICD-10-CM

## 2022-06-04 LAB — LIPID PANEL
Cholesterol: 172 mg/dL (ref 0–200)
HDL: 52.9 mg/dL (ref >39.00)
LDL Cholesterol: 106 mg/dL — ABNORMAL HIGH (ref 0–99)
NonHDL: 119.24
Total CHOL/HDL Ratio: 3
Triglycerides: 67 mg/dL (ref 0.0–149.0)
VLDL: 13.4 mg/dL (ref 0.0–40.0)

## 2022-06-04 LAB — COMPREHENSIVE METABOLIC PANEL
ALT: 33 U/L (ref 0–53)
AST: 15 U/L (ref 0–37)
Albumin: 4.7 g/dL (ref 3.5–5.2)
Alkaline Phosphatase: 56 U/L (ref 39–117)
BUN: 21 mg/dL (ref 6–23)
CO2: 25 mEq/L (ref 19–32)
Calcium: 9.7 mg/dL (ref 8.4–10.5)
Chloride: 105 mEq/L (ref 96–112)
Creatinine, Ser: 0.99 mg/dL (ref 0.40–1.50)
GFR: 100.35 mL/min (ref >60.00)
Glucose, Bld: 94 mg/dL (ref 70–99)
Potassium: 4.5 mEq/L (ref 3.5–5.1)
Sodium: 140 mEq/L (ref 135–145)
Total Bilirubin: 0.4 mg/dL (ref 0.2–1.2)
Total Protein: 7.3 g/dL (ref 6.0–8.3)

## 2022-06-04 LAB — TESTOSTERONE: Testosterone: 253.85 ng/dL — ABNORMAL LOW (ref 300.00–890.00)

## 2022-06-04 LAB — HEMOGLOBIN A1C: Hgb A1c MFr Bld: 5.6 % (ref 4.6–6.5)

## 2022-06-04 NOTE — Assessment & Plan Note (Signed)
Patient reports use of exogenous testosterone when he was younger.  He would like to check his testosterone levels today.

## 2022-06-04 NOTE — Progress Notes (Signed)
Tommi Rumps, MD Phone: [phone]  Cody Kramer is a 31 y.o. male who presents today for CPE  Diet: generally very healthy, is tracking calories, not eating many vegetables Exercise: 75 hard, has dropped 12-13 lbs in 3 weeks with calorie reduction Colonoscopy: not indicated Prostate cancer screening: not indicated Family history-  Prostate cancer: no  Colon cancer: no Vaccines-   Flu: declines  Tetanus: UTD  COVID19: declines HIV screening: UTD Hep C Screening: UTD Tobacco use: former smoker, quit 2 years ago, smoked 2 PPD for 5 years Alcohol use: no Illicit Drug use: rare marijuana use Dentist: yes Ophthalmology: no  History of atrial fibrillation: Patient has not been on any beta-blockers for this.  He has been asymptomatic.  He notes when he was diagnosed with this he was drinking 6 energy drinks a day.   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   Polyuria 07/18/2019   Adverse effect of testosterone 07/18/2019   History of tobacco abuse 07/18/2019   Peripheral edema 05/07/2018   Tendinitis of left shoulder 04/24/2022   Routine general medical examination at a health care facility 06/04/2022   Obesity 06/04/2022   Resolved Ambulatory Problems    Diagnosis Date Noted   No Resolved Ambulatory Problems   Past Medical History:  Diagnosis Date   Asthma     Family History  Problem Relation Age of Onset   Healthy Mother    Heart disease Mother    Healthy Father    COPD Maternal Grandfather     Social History   Socioeconomic History   Marital status: Married    Spouse name: Not on file   Number of children: Not on file   Years of education: Not on file   Highest education level: Not on file  Occupational History   Not on file  Tobacco Use   Smoking status: Former   Smokeless tobacco: Current  Vaping Use   Vaping Use: Every day  Substance and Sexual  Activity   Alcohol use: Not Currently   Drug use: Never   Sexual activity: Yes    Partners: Female    Birth control/protection: Condom  Other Topics Concern   Not on file  Social History Narrative   Not on file   Social Determinants of Health   Financial Resource Strain: Not on file  Food Insecurity: Not on file  Transportation Needs: Not on file  Physical Activity: Not on file  Stress: Not on file  Social Connections: Not on file  Intimate Partner Violence: Not on file    ROS  General:  Negative for nexplained weight loss, fever Skin: Negative for new or changing mole, sore that won't heal HEENT: Negative for trouble hearing, trouble seeing, ringing in ears, mouth sores, hoarseness, change in voice, dysphagia. CV:  Negative for chest pain, dyspnea, edema, palpitations Resp: Negative for cough, dyspnea, hemoptysis GI: Negative for nausea, vomiting, diarrhea, constipation, abdominal pain, melena, hematochezia. GU: Negative for dysuria, incontinence, urinary hesitance, hematuria, vaginal or penile discharge, polyuria, sexual difficulty, lumps in testicle or breasts MSK: Negative for muscle cramps or aches, joint pain or swelling Neuro: Negative for headaches, weakness, numbness, dizziness, passing out/fainting Psych: Negative for depression, anxiety, memory problems  Objective  Physical Exam Vitals:   06/04/22 0832  BP: 130/80  Pulse: 79  Temp: 97.9 F (36.6 C)  SpO2: 98%    BP Readings from Last 3 Encounters:  06/04/22  130/80  07/16/19 120/80  05/19/19 130/90   Wt Readings from Last 3 Encounters:  06/04/22 289 lb (131.1 kg)  07/16/19 247 lb 3.2 oz (112.1 kg)  05/19/19 269 lb 8 oz (122.2 kg)    Physical Exam Constitutional:      General: He is not in acute distress.    Appearance: He is not diaphoretic.  HENT:     Head: Normocephalic and atraumatic.  Cardiovascular:     Rate and Rhythm: Normal rate and regular rhythm.     Heart sounds: Normal heart  sounds.  Pulmonary:     Effort: Pulmonary effort is normal.     Breath sounds: Normal breath sounds.  Abdominal:     General: Bowel sounds are normal. There is no distension.     Palpations: Abdomen is soft.     Tenderness: There is no abdominal tenderness.  Musculoskeletal:     Right lower leg: No edema.     Left lower leg: No edema.  Lymphadenopathy:     Cervical: No cervical adenopathy.  Skin:    General: Skin is warm and dry.  Neurological:     Mental Status: He is alert.  Psychiatric:        Mood and Affect: Mood normal.      Assessment/Plan:   Routine general medical examination at a health care facility Assessment & Plan: Physical exam completed.  Encouraged continued healthy diet and exercise.  Discussed adding in some vegetables.  He declines COVID and flu vaccinations.  I counseled against marijuana use.  Congratulated on tobacco cessation.  Lab work as outlined.   Screening cholesterol level -     Comprehensive metabolic panel -     Lipid panel  Class 2 obesity due to excess calories without serious comorbidity with body mass index (BMI) of 39.0 to 39.9 in adult -     Hemoglobin A1c  Long-term current use of testosterone cypionate Assessment & Plan: Patient reports use of exogenous testosterone when he was younger.  He would like to check his testosterone levels today.  Orders: -     Testosterone  Paroxysmal atrial fibrillation ([place]) Assessment & Plan: History of this in the past.  I am going to send my note to the cardiologist he saw previously to see if they want to see him back for follow-up.   Adverse effect of testosterone, initial encounter Assessment & Plan: Patient reports use of exogenous testosterone when he was younger.  He would like to check his testosterone levels today.     Return in about 1 year (around 06/05/2023) for physical.   Tommi Rumps, MD [place]

## 2022-06-04 NOTE — Assessment & Plan Note (Signed)
History of this in the past.  I am going to send my note to the cardiologist he saw previously to see if they want to see him back for follow-up.

## 2022-06-04 NOTE — Assessment & Plan Note (Signed)
Physical exam completed.  Encouraged continued healthy diet and exercise.  Discussed adding in some vegetables.  He declines COVID and flu vaccinations.  I counseled against marijuana use.  Congratulated on tobacco cessation.  Lab work as outlined.

## 2022-06-05 ENCOUNTER — Other Ambulatory Visit: Payer: Self-pay

## 2022-06-05 DIAGNOSIS — Z79890 Hormone replacement therapy (postmenopausal): Secondary | ICD-10-CM

## 2022-06-06 ENCOUNTER — Other Ambulatory Visit (INDEPENDENT_AMBULATORY_CARE_PROVIDER_SITE_OTHER): Payer: BC Managed Care – PPO

## 2022-06-06 DIAGNOSIS — Z79890 Hormone replacement therapy (postmenopausal): Secondary | ICD-10-CM | POA: Diagnosis not present

## 2022-06-06 LAB — TESTOSTERONE: Testosterone: 250 ng/dL — ABNORMAL LOW (ref 300.00–890.00)

## 2022-06-07 ENCOUNTER — Telehealth: Payer: Self-pay | Admitting: Family Medicine

## 2022-06-07 ENCOUNTER — Other Ambulatory Visit: Payer: Self-pay | Admitting: Family Medicine

## 2022-06-07 DIAGNOSIS — R7989 Other specified abnormal findings of blood chemistry: Secondary | ICD-10-CM

## 2022-06-07 NOTE — Telephone Encounter (Signed)
Pt returning call to marissa 

## 2022-06-07 NOTE — Telephone Encounter (Signed)
Patient aware see result testosterone.

## 2022-06-10 ENCOUNTER — Telehealth: Payer: Self-pay | Admitting: Family Medicine

## 2022-06-10 NOTE — Telephone Encounter (Signed)
-----  Message from Nelva Bush, MD sent at 06/08/2022  1:03 PM EST ----- If you feel comfortable monitoring him clinically, I don't think he needs to see [place] on a regular basis.  Given that his CHADSVASC score is 0, therapy would be aimed primarily at symptom control and avoidance of prolonged tachycardia if he were to have recurrent atrial fibrillation.  Since he is asymptomatic and in sinus rhythm at this time, I would not have much to add.  An event monitor would only be helpful if you are concerned about clinically silent a-fib that would affect our management.  It might be best for him to cut down on his energy drinks and to keep an eye on his HR.  We are always happy to see him back if questions or concerns come up.  Gerald Stabs  ----- Message ----- From: Leone Haven, MD Sent: 06/04/2022   8:59 AM EST To: Nelva Bush, MD  Hi Dr End,   I saw this patient for a physical today. He saw you a couple of years ago for afib. He has not had follow-up on this since then. He has not been having any palpitations and has not been on any medication for this. Today he endorsed drinking 6 energy drinks a day back when he was diagnosed with afib and has subsequently cut those out. He was sinus rhythm on exam today. Do you think he needs to follow-up with you? Or would he need some kind of monitor to recheck for the presence of afib? Please let me know what you think. Thanks.  Randall Hiss

## 2022-06-10 NOTE — Telephone Encounter (Signed)
See documentation from cardiology.

## 2022-06-13 HISTORY — PX: TOOTH EXTRACTION: SUR596

## 2022-06-17 DIAGNOSIS — J989 Respiratory disorder, unspecified: Secondary | ICD-10-CM

## 2022-06-17 DIAGNOSIS — R509 Fever, unspecified: Secondary | ICD-10-CM

## 2022-06-19 ENCOUNTER — Telehealth: Payer: BC Managed Care – PPO | Admitting: Physician Assistant

## 2022-06-19 DIAGNOSIS — J029 Acute pharyngitis, unspecified: Secondary | ICD-10-CM

## 2022-06-19 DIAGNOSIS — Z20818 Contact with and (suspected) exposure to other bacterial communicable diseases: Secondary | ICD-10-CM

## 2022-06-19 MED ORDER — AZITHROMYCIN 250 MG PO TABS: ORAL_TABLET | ORAL | 0 refills | Status: AC

## 2022-06-19 NOTE — Progress Notes (Signed)
Virtual Visit Consent   Cody Kramer, you are scheduled for a virtual visit with a [facility] provider today. Just as with appointments in the office, your consent must be obtained to participate. Your consent will be active for this visit and any virtual visit you may have with one of our providers in the next 365 days. If you have a MyChart account, a copy of this consent can be sent to you electronically.  As this is a virtual visit, video technology does not allow for your provider to perform a traditional examination. This may limit your provider's ability to fully assess your condition. If your provider identifies any concerns that need to be evaluated in person or the need to arrange testing (such as labs, EKG, etc.), we will make arrangements to do so. Although advances in technology are sophisticated, we cannot ensure that it will always work on either your end or our end. If the connection with a video visit is poor, the visit may have to be switched to a telephone visit. With either a video or telephone visit, we are not always able to ensure that we have a secure connection.  By engaging in this virtual visit, you consent to the provision of healthcare and authorize for your insurance to be billed (if applicable) for the services provided during this visit. Depending on your insurance coverage, you may receive a charge related to this service.  I need to obtain your verbal consent now. Are you willing to proceed with your visit today? BARRON VANLOAN has provided verbal consent on 06/19/2022 for a virtual visit (video or telephone). Cody Kramer, [place]  Date: 06/19/2022 5:34 PM  Virtual Visit via Video Note   I, Cody Kramer, connected with  RENEE BEALE  ([id number], [birth year]) on 06/19/22 at  5:30 PM EST by a video-enabled telemedicine application and verified that I am speaking with the correct person using two identifiers.  Location: Patient: Virtual  Visit Location Patient: Home Provider: Virtual Visit Location Provider: Home Office   I discussed the limitations of evaluation and management by telemedicine and the availability of in person appointments. The patient expressed understanding and agreed to proceed.    History of Present Illness: Cody Kramer is a 32 y.o. who identifies as a male who was assigned male at birth, and is being seen today for concern of strep throat. Endorses sore throat, fever, chills starting yesterday into today. Both his son and now wife have been diagnosed for strep and on treatment. Concerned he now has the same.   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/18/2019   History of tobacco abuse 07/18/2019   Paroxysmal atrial fibrillation ([place]) 05/20/2019   Peripheral edema 05/07/2018    Allergies:  Allergies  Allergen Reactions   Coconut (Cocos Nucifera) Swelling   Amoxicillin Hives and Other (See Comments)    Unsure of reaction, "was a long time ago." Unsure of reaction, "was a long time ago."    Medications:  Current Outpatient Medications:    azithromycin (ZITHROMAX) 250 MG tablet, Take 2 tablets on day 1, then 1 tablet daily on days 2 through 5, Disp: 6 tablet, Rfl: 0  Observations/Objective: Patient is well-developed, well-nourished in no  acute distress.  Resting comfortably at home.  Head is normocephalic, atraumatic.  No labored breathing. Speech is clear and coherent with logical content.  Patient is alert and oriented at baseline.  Assessment and Plan: 1. Streptococcus exposure - azithromycin (ZITHROMAX) 250 MG tablet; Take 2 tablets on day 1, then 1 tablet daily on days 2 through 5  Dispense: 6 tablet; Refill: 0  2. Sore  throat - azithromycin (ZITHROMAX) 250 MG tablet; Take 2 tablets on day 1, then 1 tablet daily on days 2 through 5  Dispense: 6 tablet; Refill: 0  Giving multiple known exposures in the home and start of symptoms, will treat to cover for strep. Supportive measures and OTC medications reviewed. Giving penicillin allergy, will Rx Azithromycin.   Follow Up Instructions: I discussed the assessment and treatment plan with the patient. The patient was provided an opportunity to ask questions and all were answered. The patient agreed with the plan and demonstrated an understanding of the instructions.  A copy of instructions were sent to the patient via MyChart unless otherwise noted below.   The patient was advised to call back or seek an in-person evaluation if the symptoms worsen or if the condition fails to improve as anticipated.  Time:  I spent 10 minutes with the patient via telehealth technology discussing the above problems/concerns.    Cody Rio, PA-C

## 2022-06-19 NOTE — Patient Instructions (Signed)
Truitt Merle, thank you for joining [name], PA-C for today's virtual visit.  While this provider is not your primary care provider (PCP), if your PCP is located in our provider database this encounter information will be shared with them immediately following your visit.   [facility] account gives you access to today's visit and all your visits, tests, and labs performed at [facility] " click here if you don't have a [facility] account or go to [email or web address]  Consent: (Patient) Cody Kramer provided verbal consent for this virtual visit at the beginning of the encounter.  Current Medications: No current outpatient medications on file.   Medications ordered in this encounter:  No orders of the defined types were placed in this encounter.    *If you need refills on other medications prior to your next appointment, please contact your pharmacy*  Follow-Up: Call back or seek an in-person evaluation if the symptoms worsen or if the condition fails to improve as anticipated.  [place] [phone]  Other Instructions Strep Throat, Adult Strep throat is an infection of the throat. It is caused by germs (bacteria). Strep throat is common during the cold months of the year. It mostly affects children who are 36-25 years old. However, people of all ages can get it at any time of the year. This infection spreads from person to person through coughing, sneezing, or having close contact. What are the causes? This condition is caused by the Streptococcus pyogenes germ. What increases the risk? You care for young children. Children are more likely to get strep throat and may spread it to others. You go to crowded places. Germs can spread easily in such places. You kiss or touch someone who has strep throat. What are the signs or symptoms? Fever or chills. Redness, swelling, or pain in the tonsils or  throat. Pain or trouble when swallowing. White or yellow spots on the tonsils or throat. Tender glands in the neck and under the jaw. Bad breath. Red rash all over the body. This is rare. How is this treated? Medicines that kill germs (antibiotics). Medicines that treat pain or fever. These include: Ibuprofen or acetaminophen. Aspirin, only for people who are over the age of 31. Cough drops. Throat sprays. Follow these instructions at home: Medicines  Take over-the-counter and prescription medicines only as told by your doctor. Take your antibiotic medicine as told by your doctor. Do not stop taking the antibiotic even if you start to feel better. Eating and drinking  If you have trouble swallowing, eat soft foods until your throat feels better. Drink enough fluid to keep your pee (urine) pale yellow. To help with pain, you may have: Warm fluids, such as soup and tea. Cold fluids, such as frozen desserts or popsicles. General instructions Rinse your mouth (gargle) with a salt-water mixture 3-4 times a day or as needed. To make a salt-water mixture, dissolve -1 tsp (3-6 g) of salt in 1 cup (237 mL) of warm water. Rest as much as you can. Stay home from work or school until you have been taking antibiotics for 24 hours. Do not smoke or use any products that contain nicotine or tobacco. If you need help quitting, ask your doctor. Keep all follow-up visits. How is this prevented?  Do not share food, drinking cups, or personal items. They can cause the germs to spread. Wash your hands well with soap and water. Make sure that all people  in your house wash their hands well. Have family members tested if they have a fever or a sore throat. They may need an antibiotic if they have strep throat. Contact a doctor if: You have swelling in your neck that keeps getting bigger. You get a rash, cough, or earache. You cough up a thick fluid that is green, yellow-brown, or bloody. You have  pain that does not get better with medicine. Your symptoms get worse instead of getting better. You have a fever. Get help right away if: You vomit. You have a very bad headache. Your neck hurts or feels stiff. You have chest pain or are short of breath. You have drooling, very bad throat pain, or changes in your voice. Your neck is swollen, or the skin gets red and tender. Your mouth is dry, or you are peeing less than normal. You keep feeling more tired or have trouble waking up. Your joints are red or painful. These symptoms may be an emergency. Do not wait to see if the symptoms will go away. Get help right away. Call your local emergency services (911 in the [place] Summary Strep throat is an infection of the throat. It is caused by germs (bacteria). This infection can spread from person to person through coughing, sneezing, or having close contact. Take your medicines, including antibiotics, as told by your doctor. Do not stop taking the antibiotic even if you start to feel better. To prevent the spread of germs, wash your hands well with soap and water. Have others do the same. Do not share food, drinking cups, or personal items. Get help right away if you have a bad headache, chest pain, shortness of breath, a stiff or painful neck, or you vomit. This information is not intended to replace advice given to you by your health care provider. Make sure you discuss any questions you have with your health care provider. Document Revised: 08/22/2020 Document Reviewed: 08/22/2020 Elsevier Patient Education  [place].    If you have been instructed to have an in-person evaluation today at a local Urgent Care facility, please use the link below. It will take you to a list of all of our available [facility], including address, phone number and hours of operation. Please do not delay care.  [facility]  If you or a family member do not have a primary care  provider, use the link below to schedule a visit and establish care. When you choose a [facility] primary care physician or advanced practice provider, you gain a long-term partner in health. Find a Primary Care Provider  Learn more about 's in-office and virtual care options: [facility]

## 2022-07-16 ENCOUNTER — Telehealth: Payer: BC Managed Care – PPO | Admitting: Family Medicine

## 2022-07-16 ENCOUNTER — Telehealth: Payer: Self-pay | Admitting: Family Medicine

## 2022-07-16 ENCOUNTER — Encounter: Payer: Self-pay | Admitting: Family Medicine

## 2022-07-16 ENCOUNTER — Other Ambulatory Visit: Payer: Self-pay | Admitting: Family Medicine

## 2022-07-16 VITALS — Temp 99.9°F

## 2022-07-16 DIAGNOSIS — R509 Fever, unspecified: Secondary | ICD-10-CM | POA: Diagnosis not present

## 2022-07-16 DIAGNOSIS — J989 Respiratory disorder, unspecified: Secondary | ICD-10-CM

## 2022-07-16 LAB — POCT INFLUENZA A/B
Influenza A, POC: NEGATIVE
Influenza B, POC: NEGATIVE

## 2022-07-16 LAB — POC COVID19 BINAXNOW: SARS Coronavirus 2 Ag: NEGATIVE

## 2022-07-16 MED ORDER — PREDNISONE 20 MG PO TABS: 40.0000 mg | ORAL_TABLET | Freq: Every day | ORAL | 0 refills | Status: DC

## 2022-07-16 MED ORDER — HYDROCODONE BIT-HOMATROP MBR 5-1.5 MG/5ML PO SOLN: 5.0000 mL | Freq: Three times a day (TID) | ORAL | 0 refills | Status: DC | PRN

## 2022-07-16 NOTE — Telephone Encounter (Signed)
Please contact the patient.  I forgot to tell him to seek medical attention if he develops worsening shortness of breath, worsening abdominal discomfort, cough productive of blood, worsening fever, or new or worsening symptoms.  Thanks.

## 2022-07-16 NOTE — Assessment & Plan Note (Signed)
Patient likely has a viral respiratory illness.  Rapid COVID and rapid flu testing today were negative.  We will send out a COVID PCR test just to make sure he does not have COVID.  Discussed treatment of cough with Hycodan.  Advised to monitor for drowsiness with this and do not drive while taking it.  Discussed that the azithromycin he is on should cover for any underlying bacterial illness.  Discussed prednisone given his reported wheezing and his distant history of asthma.  CMA will contact the patient and advise him to seek medical attention if he develops worsening shortness of breath, increasing abdominal pain, worsening fevers, cough productive of blood, or any new or worsening symptoms.

## 2022-07-16 NOTE — Progress Notes (Signed)
Virtual Visit via video Note  This visit type was conducted due to national recommendations for restrictions regarding the COVID-19 pandemic (e.g. social distancing).  This format is felt to be most appropriate for this patient at this time.  All issues noted in this document were discussed and addressed.  No physical exam was performed (except for noted visual exam findings with Video Visits).   I connected with Christophe Louis today at  8:30 AM EST by a video enabled telemedicine application and verified that I am speaking with the correct person using two identifiers. Location patient: home Location provider: work Persons participating in the virtual visit: patient, provider, Camille Shillito (wife)  I discussed the limitations, risks, security and privacy concerns of performing an evaluation and management service by telephone and the availability of in person appointments. I also discussed with the patient that there may be a patient responsible charge related to this service. The patient expressed understanding and agreed to proceed.  Reason for visit: sick visit  HPI: Sinus drainage: Patient notes onset of symptoms 2 days ago.  He has chest congestion and sinus drainage.  He is coughing up yellow/tan mucus.  He developed fever this morning of 102.4 F though temperature is now down to 99.9 F after taking Tylenol.  He has postnasal drip and mild sore throat.  He has some cough and mild shortness of breath.  He had some vomiting this morning that was nonbloody and nonbilious.  He had some nausea last night.  No diarrhea.  He notes some left upper quadrant discomfort.  No headache or bodyaches.  Patient notes some left mid back pain with coughing and sneezing.  No known COVID or flu exposure though his wife and child were sick recently with respiratory illnesses.  His child tested negative for RSV.  He noted some wheezing this morning and does report a history of asthma as a child.  He  recently underwent tooth extraction and is on a macrolide currently.  He has tried Mucinex, DayQuil, and Tylenol for this.  Patient notes his wife was scheduled to have a C-section today though they have had to delay this given his symptoms.   ROS: See pertinent positives and negatives per HPI.  Past Medical History:  Diagnosis Date   Asthma    Paroxysmal atrial fibrillation [facility])     Past Surgical History:  Procedure Laterality Date   TONSILLECTOMY      Family History  Problem Relation Age of Onset   Healthy Mother    Heart disease Mother    Healthy Father    COPD Maternal Grandfather     SOCIAL HX: Former smoker   Current Outpatient Medications:    azithromycin (ZITHROMAX) 250 MG tablet, Take by mouth., Disp: , Rfl:    chlorhexidine (PERIDEX) 0.12 % solution, PLEASE SEE ATTACHED FOR DETAILED DIRECTIONS, Disp: , Rfl:    HYDROcodone bit-homatropine (HYCODAN) 5-1.5 MG/5ML syrup, Take 5 mLs by mouth every 8 (eight) hours as needed for cough., Disp: 120 mL, Rfl: 0   ibuprofen (ADVIL) 600 MG tablet, Take 600 mg by mouth every 6 (six) hours as needed. for pain, Disp: , Rfl:    predniSONE (DELTASONE) 20 MG tablet, Take 2 tablets (40 mg total) by mouth daily with breakfast., Disp: 10 tablet, Rfl: 0  EXAM: patient was examined when he came to the office for his COVID/flu testing.   VITALS: O2 sat 98%  GENERAL: alert, oriented, appears well and in no acute distress  LUNGS:  no signs of respiratory distress, breathing rate appears normal, mildly coarse breath sounds throughout, no focal abnormalities  CV: no obvious cyanosis, tachycardic, no murmurs  MS: moves all visible extremities without noticeable abnormality  Abd: soft, mild TTP on left, no guarding  ASSESSMENT AND PLAN:  Discussed the following assessment and plan:  Problem List Items Addressed This Visit     Respiratory illness with fever - Primary    Patient likely has a viral respiratory illness.  Rapid COVID and  rapid flu testing today were negative.  We will send out a COVID PCR test just to make sure he does not have COVID.  Discussed treatment of cough with Hycodan.  Advised to monitor for drowsiness with this and do not drive while taking it.  Discussed that the azithromycin he is on should cover for any underlying bacterial illness.  Discussed prednisone given his reported wheezing and his distant history of asthma.  CMA will contact the patient and advise him to seek medical attention if he develops worsening shortness of breath, increasing abdominal pain, worsening fevers, cough productive of blood, or any new or worsening symptoms.      Relevant Medications   predniSONE (DELTASONE) 20 MG tablet   HYDROcodone bit-homatropine (HYCODAN) 5-1.5 MG/5ML syrup   Other Relevant Orders   POC COVID-19 BinaxNow (Completed)   POCT Influenza A/B (Completed)    Return if symptoms worsen or fail to improve.   I discussed the assessment and treatment plan with the patient. The patient was provided an opportunity to ask questions and all were answered. The patient agreed with the plan and demonstrated an understanding of the instructions.   The patient was advised to call back or seek an in-person evaluation if the symptoms worsen or if the condition fails to improve as anticipated.   Tommi Rumps, MD

## 2022-07-17 ENCOUNTER — Encounter: Payer: Self-pay | Admitting: Nurse Practitioner

## 2022-07-17 LAB — NOVEL CORONAVIRUS, NAA: SARS-CoV-2, NAA: NOT DETECTED

## 2022-07-17 NOTE — Telephone Encounter (Signed)
I called and spoke with the  patient and informed him that if he has any worsening symptoms of abdominal pain, SOB, cough or any new symptoms and he understood.  Achsah Mcquade,cma

## 2022-07-30 ENCOUNTER — Encounter: Payer: Self-pay | Admitting: "Endocrinology

## 2022-07-30 ENCOUNTER — Ambulatory Visit: Payer: BC Managed Care – PPO | Admitting: "Endocrinology

## 2022-07-30 VITALS — BP 126/88 | HR 76 | Ht 72.0 in | Wt 297.2 lb

## 2022-07-30 DIAGNOSIS — E291 Testicular hypofunction: Secondary | ICD-10-CM

## 2022-07-30 NOTE — Progress Notes (Signed)
07/30/2022                         Endocrinology Consult Note   Cody Kramer, 32 y.o., male   Chief Complaint  Patient presents with   Establish Care    Low testosterone     Past Medical History:  Diagnosis Date   Asthma    Paroxysmal atrial fibrillation [facility])    Past Surgical History:  Procedure Laterality Date   TONSILLECTOMY     TOOTH EXTRACTION  06/2022   Social History   Socioeconomic History   Marital status: Married    Spouse name: Not on file   Number of children: Not on file   Years of education: Not on file   Highest education level: Not on file  Occupational History   Not on file  Tobacco Use   Smoking status: Former   Smokeless tobacco: Current  Vaping Use   Vaping Use: Every day  Substance and Sexual Activity   Alcohol use: Not Currently   Drug use: Never   Sexual activity: Yes    Partners: Female    Birth control/protection: Condom  Other Topics Concern   Not on file  Social History Narrative   Not on file   Social Determinants of Health   Financial Resource Strain: Not on file  Food Insecurity: Not on file  Transportation Needs: Not on file  Physical Activity: Not on file  Stress: Not on file  Social Connections: Not on file   Outpatient Encounter Medications as of 07/30/2022  Medication Sig   Multiple Vitamin (MULTIVITAMIN ADULT PO) Take 1 tablet by mouth daily.   ibuprofen (ADVIL) 600 MG tablet Take 600 mg by mouth every 6 (six) hours as needed. for pain   [DISCONTINUED] azithromycin (ZITHROMAX) 250 MG tablet Take by mouth.   [DISCONTINUED] chlorhexidine (PERIDEX) 0.12 % solution PLEASE SEE ATTACHED FOR DETAILED DIRECTIONS   [DISCONTINUED] HYDROcodone bit-homatropine (HYCODAN) 5-1.5 MG/5ML syrup Take 5 mLs by mouth every 8 (eight) hours as needed for cough.   [DISCONTINUED] predniSONE (DELTASONE) 20 MG tablet Take 2 tablets (40 mg total) by mouth daily with breakfast.   No facility-administered  encounter medications on file as of 07/30/2022.   ALLERGIES: Allergies  Allergen Reactions   Coconut (Cocos Nucifera) Swelling   Amoxicillin Hives and Other (See Comments)    Unsure of reaction, "was a long time ago." Unsure of reaction, "was a long time ago."     VACCINATION STATUS: Immunization History  Administered Date(s) Administered   Tdap 04/28/2018    HPI: Cody Kramer is a 33 y.o.-year-old man, being seen in consultation for evaluation and management of low testosterone requested by by his   provider  Leone Haven, MD.  He was found to have hypogonadism with total testostetone of 253 and 250 separated by 2 days in January 2024.   He admits for decreased libido.  He denies  trauma to testes,  chemotherapy,  testicular irradiation,  nor genitourinary surgery. No h/o cryptorchidism. He denies history of  mumps orchitis/ history of  autoimmune disorders. He admits to using androgens in an attempt to build his body when he was a teenager.   He grew and went through puberty like his peers. No personal history of  infertility - has  2 biological children. No incomplete/delayed sexual development.     No breast discomfort/gynecomastia. No abnormal sense of smell (only allergies). No hot flushes. No  vision problems.  No report of changing headaches. No FH of hypogonadism/infertility . No Family history of hemochromatosis or pituitary tumors. No recent rapid weight change. No chronic diseases. No chronic pain. Not on opiates, does not take steroids.  No history of excessive alcohol intake. No herbal medicines. Not on antidepressants. He has family history of heart failure and coronary artery disease in his mother.   ROS: Constitutional: + Progressive weight gain, + fatigue, no subjective hyperthermia/hypothermia Eyes: no blurry vision, no xerophthalmia ENT: no sore throat, no nodules palpated in throat, no dysphagia/odynophagia, no hoarseness Cardiovascular: no  CP/SOB/palpitations/leg swelling Respiratory: no cough/SOB Gastrointestinal: no N/V/D/C Musculoskeletal: no muscle/joint aches Skin: no rashes Neurological: no tremors/numbness/tingling/dizziness Psychiatric: no depression/anxiety  PE: BP 126/88   Pulse 76   Ht 6' (1.829 m)   Wt 297 lb 3.2 oz (134.8 kg)   BMI 40.31 kg/m  Wt Readings from Last 3 Encounters:  07/30/22 297 lb 3.2 oz (134.8 kg)  06/04/22 289 lb (131.1 kg)  07/16/19 247 lb 3.2 oz (112.1 kg)   Constitutional:  Body mass index is 40.31 kg/m.,  not in acute distress, + Normal State of Mind Eyes: PERRLA, EOMI, no exophthalmos ENT: moist mucous membranes, no thyromegaly, no cervical lymphadenopathy Cardiovascular: RRR, No MRG Respiratory: CTA B Gastrointestinal: abdomen soft, NT, ND, BS+ Musculoskeletal: no deformities, strength intact in all 4 Skin: moist, warm, no rashes Neurological: no tremor with outstretched hands, DTR normal in all 4 Genital exam: normal male escutcheon, no inguinal LAD, normal phallus, testes ~25 mL, no testicular masses, no penile discharge.  No gynecomastia.   CMP ( most recent) CMP     Component Value Date/Time   NA 140 06/04/2022 0856   K 4.5 06/04/2022 0856   CL 105 06/04/2022 0856   CO2 25 06/04/2022 0856   GLUCOSE 94 06/04/2022 0856   BUN 21 06/04/2022 0856   CREATININE 0.99 06/04/2022 0856   CALCIUM 9.7 06/04/2022 0856   PROT 7.3 06/04/2022 0856   ALBUMIN 4.7 06/04/2022 0856   AST 15 06/04/2022 0856   ALT 33 06/04/2022 0856   ALKPHOS 56 06/04/2022 0856   BILITOT 0.4 06/04/2022 0856   GFRNONAA >60 12/24/2018 0037   GFRAA >60 12/24/2018 0037     Diabetic Labs (most recent): Lab Results  Component Value Date   HGBA1C 5.6 06/04/2022     Lipid Panel ( most recent) Lipid Panel     Component Value Date/Time   CHOL 172 06/04/2022 0856   TRIG 67.0 06/04/2022 0856   HDL 52.90 06/04/2022 0856   CHOLHDL 3 06/04/2022 0856   VLDL 13.4 06/04/2022 0856   LDLCALC 106 (H)  06/04/2022 0856      Latest Reference Range & Units 06/04/22 08:56 06/06/22 08:07  Testosterone 300.00 - 890.00 ng/dL 253.85 (L) 250.00 (L)  (L): Data is abnormally low   ASSESSMENT: 1. Hypogonadism 2.  Hyperlipidemia 3.  Obesity  PLAN:  I have examined the patient, reviewed his labs and have had a long discussion with the patient regarding his testosterone results.   - I have discussed potential causes of hypogonadism, diagnosis of hypogonadism,  and relevant workup to confirm the diagnosis of hypogonadism before initiating testosterone replacement therapy.   -  I also discussed adverse effects of unnecessary testosterone replacement short-term and long-term.  -It is beneficial to determine etiology of hypogonadism before initiation of treatment if possible. -I have approached him for more complete laboratory work including repeat of testosterone (total and free), SHBG; FSH/LH; prolactin,  ferritin in a morning sample of serum.    -If this workup indicates  Secondary etiology for hypogonadism, he will be considered for MRI imaging of sella/pituitary .  - In this particular patient with bilaterally normal-sized testicles, his hypogonadism seems to be acute and fertility preserved.    He is not planning for more kids at this time.   If he presents with persistent hypogonadism, he will be considered for testosterone replacement therapy. -In light of his high BMI, and hyperlipidemia, he will benefit the most from weight loss.  We briefly discussed about controlling his diet, improving his sleep, and exercise regularly.     I advised him to maintain close follow-up with his PMD. - Time spent with the patient: 45 minutes, of which >50% was spent in obtaining information about his symptoms, reviewing his previous labs, evaluations, and treatments, counseling him about his hypogonadism, and developing a plan to confirm the diagnosis and long term treatment as necessary.  Cody Kramer  participated in the discussions, expressed understanding, and voiced agreement with the above plans.  All questions were answered to his satisfaction. he is encouraged to contact clinic should he have any questions or concerns prior to his return visit.  Return in about 10 days (around 08/09/2022) for Fasting Labs  in AM B4 8.  Glade Lloyd, MD [facility] [address] [place], [place] [address] Phone: [phone]  Fax: [phone]   07/30/2022, 1:28 PM  This note was partially dictated with voice recognition software. Similar sounding words can be transcribed inadequately or may not  be corrected upon review.

## 2022-07-31 ENCOUNTER — Other Ambulatory Visit: Payer: Self-pay | Admitting: "Endocrinology

## 2022-08-01 LAB — CBC WITH DIFFERENTIAL/PLATELET
Basophils Absolute: 0 10*3/uL (ref 0.0–0.2)
Basos: 1 %
EOS (ABSOLUTE): 0.1 10*3/uL (ref 0.0–0.4)
Eos: 2 %
Hematocrit: 44.6 % (ref 37.5–51.0)
Hemoglobin: 15 g/dL (ref 13.0–17.7)
Immature Grans (Abs): 0 10*3/uL (ref 0.0–0.1)
Immature Granulocytes: 0 %
Lymphocytes Absolute: 2.8 10*3/uL (ref 0.7–3.1)
Lymphs: 34 %
MCH: 30.2 pg (ref 26.6–33.0)
MCHC: 33.6 g/dL (ref 31.5–35.7)
MCV: 90 fL (ref 79–97)
Monocytes Absolute: 0.7 10*3/uL (ref 0.1–0.9)
Monocytes: 9 %
Neutrophils Absolute: 4.4 10*3/uL (ref 1.4–7.0)
Neutrophils: 54 %
Platelets: 214 10*3/uL (ref 150–450)
RBC: 4.97 x10E6/uL (ref 4.14–5.80)
RDW: 13.1 % (ref 11.6–15.4)
WBC: 8 10*3/uL (ref 3.4–10.8)

## 2022-08-01 LAB — TESTOSTERONE, FREE, TOTAL, SHBG
Sex Hormone Binding: 13.1 nmol/L — ABNORMAL LOW (ref 16.5–55.9)
Testosterone, Free: 8.7 pg/mL (ref 8.7–25.1)
Testosterone: 209 ng/dL — ABNORMAL LOW (ref 264–916)

## 2022-08-01 LAB — PROLACTIN: Prolactin: 13.4 ng/mL (ref 3.9–22.7)

## 2022-08-01 LAB — LUTEINIZING HORMONE: LH: 1.8 m[IU]/mL (ref 1.7–8.6)

## 2022-08-01 LAB — PSA: Prostate Specific Ag, Serum: 0.2 ng/mL (ref 0.0–4.0)

## 2022-08-01 LAB — FOLLICLE STIMULATING HORMONE: FSH: 1.6 m[IU]/mL (ref 1.5–12.4)

## 2022-08-14 ENCOUNTER — Encounter: Payer: Self-pay | Admitting: "Endocrinology

## 2022-08-14 ENCOUNTER — Ambulatory Visit: Payer: BC Managed Care – PPO | Admitting: "Endocrinology

## 2022-08-14 VITALS — BP 132/86 | HR 80 | Ht 72.0 in | Wt 296.6 lb

## 2022-08-14 DIAGNOSIS — E291 Testicular hypofunction: Secondary | ICD-10-CM | POA: Diagnosis not present

## 2022-08-14 DIAGNOSIS — E782 Mixed hyperlipidemia: Secondary | ICD-10-CM | POA: Insufficient documentation

## 2022-08-14 MED ORDER — "SYRINGE/NEEDLE (DISP) 21G X 1-1/2"" 3 ML MISC": 1 refills | Status: DC

## 2022-08-14 MED ORDER — TESTOSTERONE CYPIONATE 100 MG/ML IM SOLN: 50.0000 mg | INTRAMUSCULAR | 0 refills | Status: DC

## 2022-08-14 NOTE — Progress Notes (Signed)
08/14/2022                          Endocrinology follow-up note   Cody Kramer, 32 y.o., male   Chief Complaint  Patient presents with   Follow-up    Hypogonadism, male     Past Medical History:  Diagnosis Date   Asthma    Paroxysmal atrial fibrillation    Past Surgical History:  Procedure Laterality Date   TONSILLECTOMY     TOOTH EXTRACTION  06/2022   Social History   Socioeconomic History   Marital status: Married    Spouse name: Not on file   Number of children: Not on file   Years of education: Not on file   Highest education level: Not on file  Occupational History   Not on file  Tobacco Use   Smoking status: Former   Smokeless tobacco: Current  Vaping Use   Vaping Use: Every day  Substance and Sexual Activity   Alcohol use: Not Currently   Drug use: Never   Sexual activity: Yes    Partners: Female    Birth control/protection: Condom  Other Topics Concern   Not on file  Social History Narrative   Not on file   Social Determinants of Health   Financial Resource Strain: Not on file  Food Insecurity: Not on file  Transportation Needs: Not on file  Physical Activity: Not on file  Stress: Not on file  Social Connections: Not on file   Outpatient Encounter Medications as of 08/14/2022  Medication Sig   SYRINGE-NEEDLE, DISP, 3 ML 21G X 1-1/2" 3 ML MISC Use to inject testosterone every week   testosterone cypionate (DEPOTESTOTERONE CYPIONATE) 100 MG/ML injection Inject 0.5 mLs (50 mg total) into the muscle every 7 (seven) days. For IM use only   ibuprofen (ADVIL) 600 MG tablet Take 600 mg by mouth every 6 (six) hours as needed. for pain   Multiple Vitamin (MULTIVITAMIN ADULT PO) Take 1 tablet by mouth daily.   No facility-administered encounter medications on file as of 08/14/2022.   ALLERGIES: Allergies  Allergen Reactions   Coconut (Cocos Nucifera) Swelling   Amoxicillin Hives and Other (See Comments)    Unsure  of reaction, "was a long time ago." Unsure of reaction, "was a long time ago."     VACCINATION STATUS: Immunization History  Administered Date(s) Administered   Tdap 04/28/2018    HPI: Cody Kramer is a 32 y.o.-year-old man, being seen in follow up after he was seen in  consultation for evaluation and management of low testosterone requested by by his   provider  Leone Haven, MD.  He was found to have hypogonadism with total testostetone of 253 and 250 separated by 2 days in January 2024.   His repeat labs confirmed persistent hypogonadism at 209.  He wishes to be initiated on testosterone replacement therapy. He admits for decreased libido.  He denies  trauma to testes,  chemotherapy,  testicular irradiation,  nor genitourinary surgery. No h/o cryptorchidism. He denies history of  mumps orchitis/ history of  autoimmune disorders. He reports to using androgens in an attempt to build his body when he was a teenager.   He grew and went through puberty like his peers. No personal history of  infertility - has  2 biological children. No incomplete/delayed sexual development.     No breast discomfort/gynecomastia. No abnormal sense of smell (only allergies). No  hot flushes. No vision problems.  No report of changing headaches. No FH of hypogonadism/infertility . No Family history of hemochromatosis or pituitary tumors. No recent rapid weight change. No chronic diseases. No chronic pain. Not on opiates, does not take steroids.  No history of excessive alcohol intake. No herbal medicines. Not on antidepressants. He has family history of heart failure and coronary artery disease in his mother.   ROS: Constitutional: + Progressive weight gain, + fatigue, no subjective hyperthermia/hypothermia Eyes: no blurry vision, no xerophthalmia ENT: no sore throat, no nodules palpated in throat, no dysphagia/odynophagia, no hoarseness Cardiovascular: no CP/SOB/palpitations/leg  swelling Respiratory: no cough/SOB Gastrointestinal: no N/V/D/C Musculoskeletal: no muscle/joint aches Skin: no rashes Neurological: no tremors/numbness/tingling/dizziness Psychiatric: no depression/anxiety  PE: BP 132/86   Pulse 80   Ht 6' (1.829 m)   Wt 296 lb 9.6 oz (134.5 kg)   BMI 40.23 kg/m  Wt Readings from Last 3 Encounters:  08/14/22 296 lb 9.6 oz (134.5 kg)  07/30/22 297 lb 3.2 oz (134.8 kg)  06/04/22 289 lb (131.1 kg)   Constitutional:  Body mass index is 40.23 kg/m.,  not in acute distress, + Normal State of Mind Eyes: PERRLA, EOMI, no exophthalmos ENT: moist mucous membranes, no thyromegaly, no cervical lymphadenopathy  Genital exam: normal male escutcheon, no inguinal LAD, normal phallus, testes ~25 mL, no testicular masses, no penile discharge.  No gynecomastia.   CMP ( most recent) CMP     Component Value Date/Time   NA 140 06/04/2022 0856   K 4.5 06/04/2022 0856   CL 105 06/04/2022 0856   CO2 25 06/04/2022 0856   GLUCOSE 94 06/04/2022 0856   BUN 21 06/04/2022 0856   CREATININE 0.99 06/04/2022 0856   CALCIUM 9.7 06/04/2022 0856   PROT 7.3 06/04/2022 0856   ALBUMIN 4.7 06/04/2022 0856   AST 15 06/04/2022 0856   ALT 33 06/04/2022 0856   ALKPHOS 56 06/04/2022 0856   BILITOT 0.4 06/04/2022 0856   GFRNONAA >60 12/24/2018 0037   GFRAA >60 12/24/2018 0037     Diabetic Labs (most recent): Lab Results  Component Value Date   HGBA1C 5.6 06/04/2022     Lipid Panel ( most recent) Lipid Panel     Component Value Date/Time   CHOL 172 06/04/2022 0856   TRIG 67.0 06/04/2022 0856   HDL 52.90 06/04/2022 0856   CHOLHDL 3 06/04/2022 0856   VLDL 13.4 06/04/2022 0856   LDLCALC 106 (H) 06/04/2022 0856      Latest Reference Range & Units 06/04/22 08:56 06/06/22 08:07  Testosterone 300.00 - 890.00 ng/dL 253.85 (L) 250.00 (L)  (L): Data is abnormally low  Recent Results (from the past 2160 hour(s))  Comp Met (CMET)     Status: None   Collection Time:  06/04/22  8:56 AM  Result Value Ref Range   Sodium 140 135 - 145 mEq/L   Potassium 4.5 3.5 - 5.1 mEq/L   Chloride 105 96 - 112 mEq/L   CO2 25 19 - 32 mEq/L   Glucose, Bld 94 70 - 99 mg/dL   BUN 21 6 - 23 mg/dL   Creatinine, Ser 0.99 0.40 - 1.50 mg/dL   Total Bilirubin 0.4 0.2 - 1.2 mg/dL   Alkaline Phosphatase 56 39 - 117 U/L   AST 15 0 - 37 U/L   ALT 33 0 - 53 U/L   Total Protein 7.3 6.0 - 8.3 g/dL   Albumin 4.7 3.5 - 5.2 g/dL   GFR 100.35 >60.00 mL/min  Comment: Calculated using the CKD-EPI Creatinine Equation (2021)   Calcium 9.7 8.4 - 10.5 mg/dL  Lipid panel     Status: Abnormal   Collection Time: 06/04/22  8:56 AM  Result Value Ref Range   Cholesterol 172 0 - 200 mg/dL    Comment: ATP III Classification       Desirable:  < 200 mg/dL               Borderline High:  200 - 239 mg/dL          High:  > = 240 mg/dL   Triglycerides 67.0 0.0 - 149.0 mg/dL    Comment: Normal:  <150 mg/dLBorderline High:  150 - 199 mg/dL   HDL 52.90 >39.00 mg/dL   VLDL 13.4 0.0 - 40.0 mg/dL   LDL Cholesterol 106 (H) 0 - 99 mg/dL   Total CHOL/HDL Ratio 3     Comment:                Men          Women1/2 Average Risk     3.4          3.3Average Risk          5.0          4.42X Average Risk          9.6          7.13X Average Risk          15.0          11.0                       NonHDL 119.24     Comment: NOTE:  Non-HDL goal should be 30 mg/dL higher than patient's LDL goal (i.e. LDL goal of < 70 mg/dL, would have non-HDL goal of < 100 mg/dL)  HgB A1c     Status: None   Collection Time: 06/04/22  8:56 AM  Result Value Ref Range   Hgb A1c MFr Bld 5.6 4.6 - 6.5 %    Comment: Glycemic Control Guidelines for People with Diabetes:Non Diabetic:  <6%Goal of Therapy: <7%Additional Action Suggested:  >8%   Testosterone     Status: Abnormal   Collection Time: 06/04/22  8:56 AM  Result Value Ref Range   Testosterone 253.85 (L) 300.00 - 890.00 ng/dL  Testosterone     Status: Abnormal   Collection Time:  06/06/22  8:07 AM  Result Value Ref Range   Testosterone 250.00 (L) 300.00 - 890.00 ng/dL  Novel Coronavirus, NAA ([facility])     Status: None   Collection Time: 07/16/22  9:57 AM   Specimen: Nasopharyngeal(NP) swabs in vial transport medium   Nasopharynge  Previous  Result Value Ref Range   SARS-CoV-2, NAA Not Detected Not Detected    Comment: This nucleic acid amplification test was developed and its performance characteristics determined by [employer]. Nucleic acid amplification tests include RT-PCR and TMA. This test has not been FDA cleared or approved. This test has been authorized by FDA under an Emergency Use Authorization (EUA). This test is only authorized for the duration of time the declaration that circumstances exist justifying the authorization of the emergency use of in vitro diagnostic tests for detection of SARS-CoV-2 virus and/or diagnosis of COVID-19 infection under section 564(b)(1) of the Act, 21 U.S.C. [id number]) (1), unless the authorization is terminated or revoked sooner. When diagnostic testing is negative, the possibility of a false negative result  should be considered in the context of a patient's recent exposures and the presence of clinical signs and symptoms consistent with COVID-19. An individual without symptoms of COVID-19 and who is not shedding SARS-CoV-2 virus wo uld expect to have a negative (not detected) result in this assay.   POCT Influenza A/B     Status: None   Collection Time: 07/16/22 10:01 AM  Result Value Ref Range   Influenza A, POC Negative Negative   Influenza B, POC Negative Negative  POC COVID-19 BinaxNow     Status: None   Collection Time: 07/16/22 10:01 AM  Result Value Ref Range   SARS Coronavirus 2 Ag Negative Negative  Testosterone, Free, Total, SHBG     Status: Abnormal   Collection Time: 07/31/22  7:36 AM  Result Value Ref Range   Testosterone 209 (L) 264 - 916 ng/dL    Comment: Adult male reference interval  is based on a population of healthy nonobese males (BMI <30) between 71 and 68 years old. [place], [place] [phone]. PMID: [id number].    Testosterone, Free 8.7 8.7 - 25.1 pg/mL   Sex Hormone Binding 13.1 (L) 16.5 - 55.9 nmol/L  CBC with Differential/Platelet     Status: None   Collection Time: 07/31/22  7:36 AM  Result Value Ref Range   WBC 8.0 3.4 - 10.8 x10E3/uL   RBC 4.97 4.14 - 5.80 x10E6/uL   Hemoglobin 15.0 13.0 - 17.7 g/dL   Hematocrit 44.6 37.5 - 51.0 %   MCV 90 79 - 97 fL   MCH 30.2 26.6 - 33.0 pg   MCHC 33.6 31.5 - 35.7 g/dL   RDW 13.1 11.6 - 15.4 %   Platelets 214 150 - 450 x10E3/uL   Neutrophils 54 Not Estab. %   Lymphs 34 Not Estab. %   Monocytes 9 Not Estab. %   Eos 2 Not Estab. %   Basos 1 Not Estab. %   Neutrophils Absolute 4.4 1.4 - 7.0 x10E3/uL   Lymphocytes Absolute 2.8 0.7 - 3.1 x10E3/uL   Monocytes Absolute 0.7 0.1 - 0.9 x10E3/uL   EOS (ABSOLUTE) 0.1 0.0 - 0.4 x10E3/uL   Basophils Absolute 0.0 0.0 - 0.2 x10E3/uL   Immature Granulocytes 0 Not Estab. %   Immature Grans (Abs) 0.0 0.0 - 0.1 x10E3/uL  Luteinizing hormone     Status: None   Collection Time: 07/31/22  7:36 AM  Result Value Ref Range   LH 1.8 1.7 - 8.6 mIU/mL  Follicle stimulating hormone     Status: None   Collection Time: 07/31/22  7:36 AM  Result Value Ref Range   FSH 1.6 1.5 - 12.4 mIU/mL  Prolactin     Status: None   Collection Time: 07/31/22  7:36 AM  Result Value Ref Range   Prolactin 13.4 3.9 - 22.7 ng/mL  PSA     Status: None   Collection Time: 07/31/22  7:36 AM  Result Value Ref Range   Prostate Specific Ag, Serum 0.2 0.0 - 4.0 ng/mL    Comment: Roche ECLIA methodology. According to the American Urological Association, Serum PSA should decrease and remain at undetectable levels after radical prostatectomy. The AUA defines biochemical recurrence as an initial PSA value 0.2 ng/mL or greater followed by a subsequent confirmatory PSA value 0.2 ng/mL or  greater. Values obtained with different assay methods or kits cannot be used interchangeably. Results cannot be interpreted as absolute evidence of the presence or absence of malignant disease.      ASSESSMENT:  1. Hypogonadism 2.  Hyperlipidemia 3.  Obesity  PLAN:  I have reviewed and discussed his recent and available labs with him. He is confirmed to have hypogonadism likely related to his previous use of androgens. - I have discussed potential causes of hypogonadism, diagnosis of hypogonadism,  and relevant workup to confirm the diagnosis of hypogonadism before initiating testosterone replacement therapy.   -  I also discussed adverse effects of unnecessary testosterone replacement short-term and long-term.  There is no indication of suspect secondary cause in the pituitary/sella.  Hence no need for further imaging at this time.  - In this particular patient with bilaterally normal-sized testicles, his hypogonadism seems to be recent onset.  He is not planning for any fertility in the near future.  He wishes to be initiated on testosterone replacement treatment.   He will be initiated on testosterone replacement treatment starting with low-dose.  He prefers injectable options.  I discussed and initiated testosterone cypionate 50 mg IM every 7 days for a total monthly dose of 200 mg.   In light of his low SHBG which is likely related to his obesity, and in light of his mild hyperlipidemia , he is a perfect candidate for lifestyle medicine.  He will benefit the most from weight loss.  I discussed lifestyle medicine interventions with him; recommended whole food plant-based diet, improve his sleep, manage stress, and exercise regularly.   I advised him to maintain close follow-up with his PMD.   I spent  25  minutes in the care of the patient today including review of labs from Thyroid Function, CMP, and other relevant labs ; imaging/biopsy records (current and previous including  abstractions from other facilities); face-to-face time discussing  his lab results and symptoms, medications doses, his options of short and long term treatment based on the latest standards of care / guidelines;   and documenting the encounter.  Cody Kramer  participated in the discussions, expressed understanding, and voiced agreement with the above plans.  All questions were answered to his satisfaction. he is encouraged to contact clinic should he have any questions or concerns prior to his return visit.   Return in about 3 months (around 11/13/2022) for Fasting Labs  in AM B4 8.  Glade Lloyd, MD [facility] [address] [place], [place] [address] Phone: [phone]  Fax: [phone]   08/14/2022, 3:59 PM  This note was partially dictated with voice recognition software. Similar sounding words can be transcribed inadequately or may not  be corrected upon review.

## 2022-09-27 ENCOUNTER — Other Ambulatory Visit (HOSPITAL_COMMUNITY): Payer: Self-pay

## 2022-10-01 ENCOUNTER — Telehealth: Payer: Self-pay

## 2022-10-01 NOTE — Telephone Encounter (Signed)
Patient Advocate Encounter   Received notification from [facility] that prior authorization is required for Testosterone Cypionate 100MG /ML intramuscular solution  Submitted: 10/01/2022 Key [id number]  Status is pending

## 2022-10-03 NOTE — Telephone Encounter (Signed)
Pharmacy Patient Advocate Encounter  Prior Authorization has been approved   Effective dates: 10/01/22 through 09/30/25

## 2022-11-16 LAB — TESTOSTERONE, FREE, TOTAL, SHBG
Sex Hormone Binding: 15.6 nmol/L — ABNORMAL LOW (ref 16.5–55.9)
Testosterone, Free: 8.9 pg/mL (ref 8.7–25.1)
Testosterone: 261 ng/dL — ABNORMAL LOW (ref 264–916)

## 2022-11-18 ENCOUNTER — Encounter: Payer: Self-pay | Admitting: "Endocrinology

## 2022-11-18 ENCOUNTER — Ambulatory Visit: Payer: 59 | Admitting: "Endocrinology

## 2022-11-18 VITALS — BP 122/86 | HR 72 | Ht 72.0 in | Wt 296.0 lb

## 2022-11-18 DIAGNOSIS — E291 Testicular hypofunction: Secondary | ICD-10-CM | POA: Diagnosis not present

## 2022-11-18 DIAGNOSIS — E782 Mixed hyperlipidemia: Secondary | ICD-10-CM

## 2022-11-18 MED ORDER — "SYRINGE/NEEDLE (DISP) 21G X 1-1/2"" 3 ML MISC": 1 refills | Status: AC

## 2022-11-18 MED ORDER — TESTOSTERONE CYPIONATE 100 MG/ML IM SOLN: 50.0000 mg | INTRAMUSCULAR | 1 refills | Status: DC

## 2022-11-18 NOTE — Progress Notes (Signed)
11/18/2022                          Endocrinology follow-up note   Cody Kramer, 32 y.o., male   Chief Complaint  Patient presents with   Follow-up    Hypogonadism     Past Medical History:  Diagnosis Date   Asthma    Paroxysmal atrial fibrillation [facility])    Past Surgical History:  Procedure Laterality Date   TONSILLECTOMY     TOOTH EXTRACTION  06/2022   Social History   Socioeconomic History   Marital status: Married    Spouse name: Not on file   Number of children: Not on file   Years of education: Not on file   Highest education level: Not on file  Occupational History   Not on file  Tobacco Use   Smoking status: Former   Smokeless tobacco: Current  Vaping Use   Vaping Use: Every day  Substance and Sexual Activity   Alcohol use: Not Currently   Drug use: Never   Sexual activity: Yes    Partners: Female    Birth control/protection: Condom  Other Topics Concern   Not on file  Social History Narrative   Not on file   Social Determinants of Health   Financial Resource Strain: Not on file  Food Insecurity: Not on file  Transportation Needs: Not on file  Physical Activity: Not on file  Stress: Not on file  Social Connections: Not on file   Outpatient Encounter Medications as of 11/18/2022  Medication Sig   ibuprofen (ADVIL) 600 MG tablet Take 600 mg by mouth every 6 (six) hours as needed. for pain   Multiple Vitamin (MULTIVITAMIN ADULT PO) Take 1 tablet by mouth daily.   SYRINGE-NEEDLE, DISP, 3 ML 21G X 1-1/2" 3 ML MISC Use to inject testosterone every week   testosterone cypionate (DEPOTESTOTERONE CYPIONATE) 100 MG/ML injection Inject 0.5-1 mLs (50-100 mg total) into the muscle every 7 (seven) days. Inject 50 mg and 100mg  alternately weekly for a total monthly dose of 300mg .   [DISCONTINUED] SYRINGE-NEEDLE, DISP, 3 ML 21G X 1-1/2" 3 ML MISC Use to inject testosterone every week   [DISCONTINUED] testosterone cypionate  (DEPOTESTOTERONE CYPIONATE) 100 MG/ML injection Inject 0.5 mLs (50 mg total) into the muscle every 7 (seven) days. For IM use only   No facility-administered encounter medications on file as of 11/18/2022.   ALLERGIES: Allergies  Allergen Reactions   Coconut (Cocos Nucifera) Swelling   Amoxicillin Hives and Other (See Comments)    Unsure of reaction, "was a long time ago." Unsure of reaction, "was a long time ago."     VACCINATION STATUS: Immunization History  Administered Date(s) Administered   Tdap 04/28/2018    HPI: Cody Kramer is a 32 y.o.-year-old man, being seen in follow up after he was seen in  consultation for evaluation and management of low testosterone requested by by his   provider  Glori Luis, MD.  He was found to have hypogonadism with total testostetone of 253 and 250 separated by 2 days in January 2024.   His repeat labs confirmed persistent hypogonadism at 209.  He was initiated on testosterone replacement therapy with testosterone cypionate 50 mg IM every 7 days.  He returns with slight improvement in his total testosterone 261.  He d does not report significant symptomatic improvement   He admits for decreased libido.  He denies  trauma  to testes,  chemotherapy,  testicular irradiation,  nor genitourinary surgery. No h/o cryptorchidism. He denies history of  mumps orchitis/ history of  autoimmune disorders. He reports to using androgens in an attempt to build his body when he was a teenager.   He grew and went through puberty like his peers. No personal history of  infertility - has  2 biological children. No incomplete/delayed sexual development.     No breast discomfort/gynecomastia. No abnormal sense of smell (only allergies). No hot flushes. No vision problems.  No report of changing headaches. No FH of hypogonadism/infertility . No Family history of hemochromatosis or pituitary tumors. No recent rapid weight change. No chronic diseases. No  chronic pain. Not on opiates, does not take steroids.  No history of excessive alcohol intake. No herbal medicines. Not on antidepressants. He has family history of heart failure and coronary artery disease in his mother.   ROS: Constitutional: + Progressive weight gain, + fatigue, no subjective hyperthermia/hypothermia Eyes: no blurry vision, no xerophthalmia ENT: no sore throat, no nodules palpated in throat, no dysphagia/odynophagia, no hoarseness   PE: BP 122/86   Pulse 72   Ht 6' (1.829 m)   Wt 296 lb (134.3 kg)   BMI 40.14 kg/m  Wt Readings from Last 3 Encounters:  11/18/22 296 lb (134.3 kg)  08/14/22 296 lb 9.6 oz (134.5 kg)  07/30/22 297 lb 3.2 oz (134.8 kg)   Constitutional:  Body mass index is 40.14 kg/m.,  not in acute distress, + Normal State of Mind Eyes: PERRLA, EOMI, no exophthalmos ENT: moist mucous membranes, no thyromegaly, no cervical lymphadenopathy  Genital exam: normal male escutcheon, no inguinal LAD, normal phallus, testes ~25 mL, no testicular masses, no penile discharge.  No gynecomastia.   CMP ( most recent) CMP     Component Value Date/Time   NA 140 06/04/2022 0856   K 4.5 06/04/2022 0856   CL 105 06/04/2022 0856   CO2 25 06/04/2022 0856   GLUCOSE 94 06/04/2022 0856   BUN 21 06/04/2022 0856   CREATININE 0.99 06/04/2022 0856   CALCIUM 9.7 06/04/2022 0856   PROT 7.3 06/04/2022 0856   ALBUMIN 4.7 06/04/2022 0856   AST 15 06/04/2022 0856   ALT 33 06/04/2022 0856   ALKPHOS 56 06/04/2022 0856   BILITOT 0.4 06/04/2022 0856   GFRNONAA >60 12/24/2018 0037   GFRAA >60 12/24/2018 0037     Diabetic Labs (most recent): Lab Results  Component Value Date   HGBA1C 5.6 06/04/2022     Lipid Panel ( most recent) Lipid Panel     Component Value Date/Time   CHOL 172 06/04/2022 0856   TRIG 67.0 06/04/2022 0856   HDL 52.90 06/04/2022 0856   CHOLHDL 3 06/04/2022 0856   VLDL 13.4 06/04/2022 0856   LDLCALC 106 (H) 06/04/2022 0856      Latest  Reference Range & Units 06/04/22 08:56 06/06/22 08:07  Testosterone 300.00 - 890.00 ng/dL 161.09 (L) 604.54 (L)  (L): Data is abnormally low  Recent Results (from the past 2160 hour(s))  Testosterone, Free, Total, SHBG     Status: Abnormal   Collection Time: 11/12/22  8:38 AM  Result Value Ref Range   Testosterone 261 (L) 264 - 916 ng/dL    Comment: Adult male reference interval is based on a population of healthy nonobese males (BMI <30) between 81 and 92 years old. [place], et.al. JCEM [phone]. PMID: [id number].    Testosterone, Free 8.9 8.7 - 25.1 pg/mL   Sex  Hormone Binding 15.6 (L) 16.5 - 55.9 nmol/L     ASSESSMENT: 1. Hypogonadism 2.  Hyperlipidemia 3.  Obesity  PLAN:  I have reviewed and discussed his new and available lab results with him.  He wishes to be continued on testosterone replacement therapy. He is confirmed to have hypogonadism likely related to his previous use of androgens. Is responding to testosterone replacement, however not optimal levels. I advised him to increase his testosterone supplement to 50 mg alternating with 100 mg IM every week.  This will give him a total monthly dose of 300 mg. - I have discussed potential causes of hypogonadism, diagnosis of hypogonadism,  and relevant workup to confirm the diagnosis of hypogonadism before initiating testosterone replacement therapy.   -  I also discussed adverse effects of unnecessary testosterone replacement short-term and long-term.  There is no indication of suspect secondary cause in the pituitary/sella.  Hence no need for further imaging at this time.  - In this particular patient with bilaterally normal-sized testicles, his hypogonadism seems to be recent onset.  He is not planning for any fertility in the near future.  In light of his low SHBG which is likely related to his obesity, and in light of his mild hyperlipidemia , he is a perfect candidate for lifestyle medicine.  He will benefit the  most from weight loss.  I discussed lifestyle medicine interventions with him; recommended whole food plant-based diet, improve his sleep, manage stress, and exercise regularly. -He will have fasting lipid panel along with his testosterone next visit.   I advised him to maintain close follow-up with his PMD.    I spent  21  minutes in the care of the patient today including review of labs from Thyroid Function, CMP, and other relevant labs ; imaging/biopsy records (current and previous including abstractions from other facilities); face-to-face time discussing  his lab results and symptoms, medications doses, his options of short and long term treatment based on the latest standards of care / guidelines;   and documenting the encounter.  Cody Kramer  participated in the discussions, expressed understanding, and voiced agreement with the above plans.  All questions were answered to his satisfaction. he is encouraged to contact clinic should he have any questions or concerns prior to his return visit.    Return in about 4 months (around 03/21/2023) for Fasting Labs  in AM B4 8.  Marquis Lunch, MD [facility] [address] [place], [place] [address] Phone: [phone]  Fax: [phone]   11/18/2022, 12:43 PM  This note was partially dictated with voice recognition software. Similar sounding words can be transcribed inadequately or may not  be corrected upon review.

## 2023-02-02 ENCOUNTER — Other Ambulatory Visit: Payer: Self-pay | Admitting: "Endocrinology

## 2023-02-19 ENCOUNTER — Telehealth: Payer: Self-pay

## 2023-02-19 NOTE — Telephone Encounter (Signed)
Pt called stating he's injecting Testosterone 50mg  alternating with 100mg  weekly. States he has noticed his "testicles retracting a lot".

## 2023-02-20 NOTE — Telephone Encounter (Signed)
Spoke with pt he stated his wife had been injecting 100mg  weekly. He would like to try taking the 50mg  alternating with 100mg  every other week that you originally ordered.

## 2023-03-21 ENCOUNTER — Ambulatory Visit: Payer: 59 | Admitting: "Endocrinology

## 2023-06-09 ENCOUNTER — Other Ambulatory Visit: Payer: Self-pay | Admitting: "Endocrinology

## 2023-06-11 LAB — LIPID PANEL
Chol/HDL Ratio: 3.6 {ratio} (ref 0.0–5.0)
Cholesterol, Total: 185 mg/dL (ref 100–199)
HDL: 51 mg/dL (ref >39)
LDL Chol Calc (NIH): 122 mg/dL — ABNORMAL HIGH (ref 0–99)
Triglycerides: 65 mg/dL (ref 0–149)
VLDL Cholesterol Cal: 12 mg/dL (ref 5–40)

## 2023-06-11 LAB — TESTOSTERONE, FREE, TOTAL, SHBG
Sex Hormone Binding: 14.9 nmol/L — ABNORMAL LOW (ref 16.5–55.9)
Testosterone, Free: 8.3 pg/mL — ABNORMAL LOW (ref 8.7–25.1)
Testosterone: 177 ng/dL — ABNORMAL LOW (ref 264–916)

## 2023-06-13 ENCOUNTER — Ambulatory Visit: Payer: 59 | Admitting: "Endocrinology

## 2023-06-16 ENCOUNTER — Encounter: Payer: Self-pay | Admitting: Family Medicine

## 2023-06-16 ENCOUNTER — Encounter: Payer: Self-pay | Admitting: "Endocrinology

## 2023-06-16 ENCOUNTER — Ambulatory Visit (INDEPENDENT_AMBULATORY_CARE_PROVIDER_SITE_OTHER): Payer: 59 | Admitting: Family Medicine

## 2023-06-16 ENCOUNTER — Ambulatory Visit: Payer: 59 | Admitting: "Endocrinology

## 2023-06-16 VITALS — BP 134/92 | HR 88 | Ht 72.0 in | Wt 279.4 lb

## 2023-06-16 VITALS — BP 142/98 | HR 91 | Temp 98.2°F | Ht 72.0 in | Wt 281.0 lb

## 2023-06-16 DIAGNOSIS — R030 Elevated blood-pressure reading, without diagnosis of hypertension: Secondary | ICD-10-CM | POA: Diagnosis not present

## 2023-06-16 DIAGNOSIS — Z0001 Encounter for general adult medical examination with abnormal findings: Secondary | ICD-10-CM | POA: Diagnosis not present

## 2023-06-16 DIAGNOSIS — Z114 Encounter for screening for human immunodeficiency virus [HIV]: Secondary | ICD-10-CM

## 2023-06-16 DIAGNOSIS — F419 Anxiety disorder, unspecified: Secondary | ICD-10-CM | POA: Insufficient documentation

## 2023-06-16 DIAGNOSIS — N50812 Left testicular pain: Secondary | ICD-10-CM | POA: Diagnosis not present

## 2023-06-16 DIAGNOSIS — E291 Testicular hypofunction: Secondary | ICD-10-CM | POA: Diagnosis not present

## 2023-06-16 DIAGNOSIS — Z1159 Encounter for screening for other viral diseases: Secondary | ICD-10-CM

## 2023-06-16 DIAGNOSIS — E782 Mixed hyperlipidemia: Secondary | ICD-10-CM | POA: Diagnosis not present

## 2023-06-16 DIAGNOSIS — B372 Candidiasis of skin and nail: Secondary | ICD-10-CM | POA: Insufficient documentation

## 2023-06-16 MED ORDER — TESTOSTERONE CYPIONATE 100 MG/ML IM SOLN: 100.0000 mg | INTRAMUSCULAR | 1 refills | Status: DC

## 2023-06-16 MED ORDER — KETOCONAZOLE 2 % EX CREA: 1.0000 | TOPICAL_CREAM | Freq: Every day | CUTANEOUS | 0 refills | Status: DC

## 2023-06-16 NOTE — Progress Notes (Signed)
Marikay Alar, MD Phone: [phone]  Cody Kramer is a 33 y.o. male who presents today for CPE.  Diet: The patient notes he started the carnivore diet last week, not eating any vegetables Exercise: Lifting multiple days a week, not much cardio Colonoscopy: Not indicated Prostate cancer screening: Not indicated Family history-  Prostate cancer: no  Colon cancer: no Vaccines-   Flu: Declines  Tetanus: Up-to-date  COVID19: Declines HIV screening: Due Hep C Screening: Due Tobacco use: no Alcohol use: no Illicit Drug use: no Dentist: yes Ophthalmology: no  Elevated blood pressure: Notes elevated today when he was at the endocrinologist though does not typically check at home.  Rash: Patient notes a rash in his left groin this been present for some time now.  He tried multiple over-the-counter medicines and also tried his children's nystatin.  Notes it itches occasionally.  Left testicle pain: Patient notes occasional discomfort in his left testicle.  It is not consistent.  Has been going on intermittently for 3 weeks.  Notes his left testicle has retracted some intermittently as well.  Anxiety: Patient reports medical related anxiety as he is the sole breadwinner for his house.  He just wants to make sure everything is okay with him.   Active Ambulatory Problems    Diagnosis Date Noted   Paroxysmal atrial fibrillation (HCC) 05/20/2019   Hypersomnia 07/18/2019   Polyuria 07/18/2019   Adverse effect of testosterone 07/18/2019   History of tobacco abuse 07/18/2019   Peripheral edema 05/07/2018   Tendinitis of left shoulder 04/24/2022   Encounter for general adult medical examination with abnormal findings 06/04/2022   Morbid obesity (HCC) 06/04/2022   Respiratory illness with fever 07/16/2022   Hypogonadism, male 08/14/2022   Mixed hyperlipidemia 08/14/2022   Candidal intertrigo 06/16/2023   Pain in left testicle 06/16/2023   Anxiety 06/16/2023   Elevated blood  pressure reading 06/16/2023   Resolved Ambulatory Problems    Diagnosis Date Noted   No Resolved Ambulatory Problems   Past Medical History:  Diagnosis Date   Asthma     Family History  Problem Relation Age of Onset   Hypertension Mother    Healthy Mother    Heart disease Mother    Hyperlipidemia Mother    Healthy Father    COPD Maternal Grandfather     Social History   Socioeconomic History   Marital status: Married    Spouse name: Not on file   Number of children: Not on file   Years of education: Not on file   Highest education level: Not on file  Occupational History   Not on file  Tobacco Use   Smoking status: Former   Smokeless tobacco: Current  Vaping Use   Vaping status: Every Day  Substance and Sexual Activity   Alcohol use: Not Currently   Drug use: Never   Sexual activity: Yes    Partners: Female    Birth control/protection: Condom  Other Topics Concern   Not on file  Social History Narrative   Not on file   Social Drivers of Health   Financial Resource Strain: Not on file  Food Insecurity: Not on file  Transportation Needs: Not on file  Physical Activity: Not on file  Stress: Not on file  Social Connections: Not on file  Intimate Partner Violence: Not on file    ROS  General:  Negative for nexplained weight loss, fever Skin: Negative for new or changing mole, sore that won't heal HEENT: Negative for trouble  hearing, trouble seeing, ringing in ears, mouth sores, hoarseness, change in voice, dysphagia. CV:  Negative for chest pain, dyspnea, edema, palpitations Resp: Negative for cough, dyspnea, hemoptysis GI: Negative for nausea, vomiting, diarrhea, constipation, abdominal pain, melena, hematochezia. GU: Negative for dysuria, incontinence, urinary hesitance, hematuria, vaginal or penile discharge, polyuria, sexual difficulty, lumps in testicle or breasts MSK: Negative for muscle cramps or aches, joint pain or swelling Neuro: Negative for  headaches, weakness, numbness, dizziness, passing out/fainting Psych: Positive for anxiety, negative for depression, memory problems  Objective  Physical Exam Vitals:   06/16/23 1315 06/16/23 1345  BP: (!) 150/100 (!) 142/98  Pulse: 91   Temp: 98.2 F (36.8 C)   SpO2: 97%     BP Readings from Last 3 Encounters:  06/16/23 (!) 142/98  06/16/23 (!) 134/92  11/18/22 122/86   Wt Readings from Last 3 Encounters:  06/16/23 281 lb (127.5 kg)  06/16/23 279 lb 6.4 oz (126.7 kg)  11/18/22 296 lb (134.3 kg)    Physical Exam Constitutional:      General: He is not in acute distress.    Appearance: He is not diaphoretic.  HENT:     Head: Normocephalic and atraumatic.  Cardiovascular:     Rate and Rhythm: Normal rate and regular rhythm.     Heart sounds: Normal heart sounds.  Pulmonary:     Effort: Pulmonary effort is normal.     Breath sounds: Normal breath sounds.  Abdominal:     General: Bowel sounds are normal. There is no distension.     Palpations: Abdomen is soft.     Tenderness: There is no abdominal tenderness.  Genitourinary:    Comments: Normal scrotum, bilateral testicles are without masses, normal epididymis, patient's left testicle is riding higher than the right testicle though it is in a normal orientation with no tenderness Musculoskeletal:     Right lower leg: No edema.     Left lower leg: No edema.  Lymphadenopathy:     Cervical: No cervical adenopathy.  Skin:    General: Skin is warm and dry.     Comments: Left inguinal candidal intertrigo noted  Neurological:     Mental Status: He is alert.  Psychiatric:     Comments: Anxiety reported by patient      Assessment/Plan:   Encounter for general adult medical examination with abnormal findings Assessment & Plan: Physical exam completed.  Encouraged healthy diet and exercise.  Discussed adding in cardio to his exercise regimen.  Discussed he should eat vegetables.  If he is not going to eat any  vegetables he needs to add a multivitamin.  He declines flu and COVID vaccination.  Hepatitis C and HIV screening will be completed today.  Most recent cholesterol panel was reviewed.   Candidal intertrigo Assessment & Plan: Rash appears consistent with candidal intertrigo.  We will treat with ketoconazole.  If not improving he will let us know.  Orders: -     Ketoconazole; Apply 1 Application topically daily.  Dispense: 15 g; Refill: 0  Pain in left testicle Assessment & Plan: Ultrasound ordered to evaluate further.  History does not seem consistent with testicular torsion.  Orders: -     US SCROTUM; Future  Encounter for screening for HIV -     HIV Antibody (routine testing w rflx)  Need for hepatitis C screening test -     Hepatitis C antibody  Anxiety Assessment & Plan: Patient with some anxiety surrounding medical issues.  We are  working up the issues he reported today and getting some answers should help with the anxiety.   Elevated blood pressure reading Assessment & Plan: Elevated today.  Patient will start checking at home after being at rest for 10 to 15 minutes.  He will return in 3 weeks for recheck with me.     Return in about 3 weeks (around 07/07/2023) for BP with PCP.   Marikay Alar, MD [facility]

## 2023-06-16 NOTE — Assessment & Plan Note (Signed)
Rash appears consistent with candidal intertrigo.  We will treat with ketoconazole.  If not improving he will let us know.

## 2023-06-16 NOTE — Assessment & Plan Note (Signed)
Physical exam completed.  Encouraged healthy diet and exercise.  Discussed adding in cardio to his exercise regimen.  Discussed he should eat vegetables.  If he is not going to eat any vegetables he needs to add a multivitamin.  He declines flu and COVID vaccination.  Hepatitis C and HIV screening will be completed today.  Most recent cholesterol panel was reviewed.

## 2023-06-16 NOTE — Assessment & Plan Note (Signed)
Patient with some anxiety surrounding medical issues.  We are working up the issues he reported today and getting some answers should help with the anxiety.

## 2023-06-16 NOTE — Patient Instructions (Signed)
Start checking your BP at home.  If your rash does not improve with the ketoconazole please let us know.

## 2023-06-16 NOTE — Assessment & Plan Note (Signed)
Ultrasound ordered to evaluate further.  History does not seem consistent with testicular torsion.

## 2023-06-16 NOTE — Progress Notes (Signed)
06/16/2023                         Endocrinology follow-up note   Cody Kramer, 33 y.o., male   Chief Complaint  Patient presents with   Follow-up    Hypogonadism, male     Past Medical History:  Diagnosis Date   Asthma    Paroxysmal atrial fibrillation [facility])    Past Surgical History:  Procedure Laterality Date   TONSILLECTOMY     TOOTH EXTRACTION  06/2022   Social History   Socioeconomic History   Marital status: Married    Spouse name: Not on file   Number of children: Not on file   Years of education: Not on file   Highest education level: Not on file  Occupational History   Not on file  Tobacco Use   Smoking status: Former   Smokeless tobacco: Current  Vaping Use   Vaping status: Every Day  Substance and Sexual Activity   Alcohol use: Not Currently   Drug use: Never   Sexual activity: Yes    Partners: Female    Birth control/protection: Condom  Other Topics Concern   Not on file  Social History Narrative   Not on file   Social Drivers of Health   Financial Resource Strain: Not on file  Food Insecurity: Not on file  Transportation Needs: Not on file  Physical Activity: Not on file  Stress: Not on file  Social Connections: Not on file   Outpatient Encounter Medications as of 06/16/2023  Medication Sig   ibuprofen (ADVIL) 600 MG tablet Take 600 mg by mouth every 6 (six) hours as needed. for pain   Multiple Vitamin (MULTIVITAMIN ADULT PO) Take 1 tablet by mouth daily.   SYRINGE-NEEDLE, DISP, 3 ML 21G X 1-1/2" 3 ML MISC Use to inject testosterone every week   testosterone cypionate (DEPOTESTOTERONE CYPIONATE) 100 MG/ML injection Inject 1 mL (100 mg total) into the muscle every 7 (seven) days. For IM use only   [DISCONTINUED] testosterone cypionate (DEPOTESTOTERONE CYPIONATE) 100 MG/ML injection INJECT 0.5-1 MLS (50-100 MG TOTAL) INTO THE MUSCLE EVERY 7 DAYS. INJECT 50 MG AND 100MG  ALTERNATELY WEEKLY FOR A TOTAL MONTHLY  DOSE OF 300MG .   No facility-administered encounter medications on file as of 06/16/2023.   ALLERGIES: Allergies  Allergen Reactions   Coconut (Cocos Nucifera) Swelling   Amoxicillin Hives and Other (See Comments)    Unsure of reaction, "was a long time ago." Unsure of reaction, "was a long time ago."     VACCINATION STATUS: Immunization History  Administered Date(s) Administered   Tdap 04/28/2018    HPI: Cody Kramer is a 33 y.o.-year-old man, being seen in follow up after he was seen in  consultation for evaluation and management of low testosterone requested by by his   provider  Cody Luis, MD.  After appropriate workup confirmed hypogonadism likely related to AAS, he was initiated on testosterone injections.  He is currently on 300 mg of testosterone IM monthly.  He returns with suboptimal total testosterone, associated with low SHBG.     He d does not report significant symptomatic improvement   He admits for decreased libido.  He denies  trauma to testes,  chemotherapy,  testicular irradiation,  nor genitourinary surgery. No h/o cryptorchidism. He denies history of  mumps orchitis/ history of  autoimmune disorders. He reports to using androgens in an attempt to build his body  when he was a teenager.   He grew and went through puberty like his peers. No personal history of  infertility - has  2 biological children. No incomplete/delayed sexual development.     No breast discomfort/gynecomastia. No abnormal sense of smell (only allergies). No hot flushes. No vision problems.  No report of changing headaches. No FH of hypogonadism/infertility . No Family history of hemochromatosis or pituitary tumors. No recent rapid weight change. No chronic diseases. No chronic pain. Not on opiates, does not take steroids.  No history of excessive alcohol intake. No herbal medicines. Not on antidepressants. He has family history of heart failure and coronary artery disease in  his mother.   ROS: Constitutional: + Presents with weight loss  + fatigue, no subjective hyperthermia/hypothermia    PE: BP (!) 134/92   Pulse 88   Ht 6' (1.829 m)   Wt 279 lb 6.4 oz (126.7 kg)   BMI 37.89 kg/m  Wt Readings from Last 3 Encounters:  06/16/23 279 lb 6.4 oz (126.7 kg)  11/18/22 296 lb (134.3 kg)  08/14/22 296 lb 9.6 oz (134.5 kg)   Constitutional:  Body mass index is 37.89 kg/m.,  not in acute distress, + Normal State of Mind Eyes: PERRLA, EOMI, no exophthalmos ENT: moist mucous membranes, no thyromegaly, no cervical lymphadenopathy  Genital exam: normal male escutcheon, no inguinal LAD, normal phallus, testes ~25 mL, no testicular masses, no penile discharge.  No gynecomastia.   CMP ( most recent) CMP     Component Value Date/Time   NA 140 06/04/2022 0856   K 4.5 06/04/2022 0856   CL 105 06/04/2022 0856   CO2 25 06/04/2022 0856   GLUCOSE 94 06/04/2022 0856   BUN 21 06/04/2022 0856   CREATININE 0.99 06/04/2022 0856   CALCIUM 9.7 06/04/2022 0856   PROT 7.3 06/04/2022 0856   ALBUMIN 4.7 06/04/2022 0856   AST 15 06/04/2022 0856   ALT 33 06/04/2022 0856   ALKPHOS 56 06/04/2022 0856   BILITOT 0.4 06/04/2022 0856   GFRNONAA >60 12/24/2018 0037   GFRAA >60 12/24/2018 0037     Diabetic Labs (most recent): Lab Results  Component Value Date   HGBA1C 5.6 06/04/2022     Lipid Panel ( most recent) Lipid Panel     Component Value Date/Time   CHOL 185 06/09/2023 0713   TRIG 65 06/09/2023 0713   HDL 51 06/09/2023 0713   CHOLHDL 3.6 06/09/2023 0713   CHOLHDL 3 06/04/2022 0856   VLDL 13.4 06/04/2022 0856   LDLCALC 122 (H) 06/09/2023 0713   LABVLDL 12 06/09/2023 0713     Recent Results (from the past 2160 hours)  Testosterone, Free, Total, SHBG     Status: Abnormal   Collection Time: 06/09/23  7:13 AM  Result Value Ref Range   Testosterone 177 (L) 264 - 916 ng/dL    Comment: Adult male reference interval is based on a population of healthy  nonobese males (BMI <30) between 32 and 49 years old. Travison, et.al. JCEM [id number]. PMID: [id number].    Testosterone, Free 8.3 (L) 8.7 - 25.1 pg/mL   Sex Hormone Binding 14.9 (L) 16.5 - 55.9 nmol/L  Lipid panel     Status: Abnormal   Collection Time: 06/09/23  7:13 AM  Result Value Ref Range   Cholesterol, Total 185 100 - 199 mg/dL   Triglycerides 65 0 - 149 mg/dL   HDL 51 >62 mg/dL   VLDL Cholesterol Cal 12 5 - 40 mg/dL  LDL Chol Calc (NIH) 122 (H) 0 - 99 mg/dL   Chol/HDL Ratio 3.6 0.0 - 5.0 ratio    Comment:                                   T. Chol/HDL Ratio                                             Men  Women                               1/2 Avg.Risk  3.4    3.3                                   Avg.Risk  5.0    4.4                                2X Avg.Risk  9.6    7.1                                3X Avg.Risk 23.4   11.0      ASSESSMENT: 1. Hypogonadism 2.  Hyperlipidemia 3.  Obesity  PLAN:  I have reviewed and discussed his new and available lab results with him.  He wishes to be continued on testosterone replacement therapy. He is confirmed to have hypogonadism likely related to his previous use of androgens. His previsit labs are showing total testosterone not in the optimal range.  I advised him to increase his testosterone to 100 mg IM every week.  This will give him a total of 400 mg of testosterone monthly.    - I have discussed potential causes of hypogonadism, diagnosis of hypogonadism,  and relevant workup to confirm the diagnosis of hypogonadism before initiating testosterone replacement therapy.   -  I also discussed adverse effects of unnecessary testosterone replacement short-term and long-term.  There is no indication of suspect secondary cause in the pituitary/sella.  Hence no need for further imaging at this time.  - In this particular patient with bilaterally normal-sized testicles, his hypogonadism seems to be recent onset.  He is not  planning for any fertility in the near future.  In light of his low SHBG which is likely related to his obesity,  and in light of his mild hyperlipidemia , he remains perfect candidate for lifestyle medicine.   He would benefit the most from weight loss.  I discussed lifestyle medicine interventions with him; recommended whole food plant-based diet, improve his sleep, manage stress, and exercise regularly. -He will have fasting lipid panel along with his testosterone next visit.   I advised him to maintain close follow-up with his PMD.   I spent  21  minutes in the care of the patient today including review of labs from Thyroid Function, CMP, and other relevant labs ; imaging/biopsy records (current and previous including abstractions from other facilities); face-to-face time discussing  his lab results and symptoms, medications doses, his options of short and long term treatment based on the latest standards of  care / guidelines;   and documenting the encounter.  Cody Kramer  participated in the discussions, expressed understanding, and voiced agreement with the above plans.  All questions were answered to his satisfaction. he is encouraged to contact clinic should he have any questions or concerns prior to his return visit.    Return in about 4 months (around 10/14/2023) for Fasting Labs  in AM B4 8.  Marquis Lunch, MD [facility] [address] [place], [place] [address] Phone: [phone]  Fax: [phone]   06/16/2023, 11:34 AM  This note was partially dictated with voice recognition software. Similar sounding words can be transcribed inadequately or may not  be corrected upon review.

## 2023-06-16 NOTE — Assessment & Plan Note (Signed)
Elevated today.  Patient will start checking at home after being at rest for 10 to 15 minutes.  He will return in 3 weeks for recheck with me.

## 2023-06-17 ENCOUNTER — Telehealth: Payer: Self-pay | Admitting: Family Medicine

## 2023-06-17 ENCOUNTER — Encounter: Payer: Self-pay | Admitting: Family Medicine

## 2023-06-17 LAB — HIV ANTIBODY (ROUTINE TESTING W REFLEX): HIV 1&2 Ab, 4th Generation: NONREACTIVE

## 2023-06-17 LAB — HEPATITIS C ANTIBODY: Hepatitis C Ab: NONREACTIVE

## 2023-06-17 NOTE — Telephone Encounter (Signed)
 Lft pt vm to call ofc to sch [place]. thanks ?

## 2023-06-18 ENCOUNTER — Telehealth: Payer: Self-pay | Admitting: Family Medicine

## 2023-06-18 NOTE — Telephone Encounter (Signed)
 Lft pt vm to call ofc to sch [place]. thanks ?

## 2023-06-25 ENCOUNTER — Ambulatory Visit
Admission: RE | Admit: 2023-06-25 | Discharge: 2023-06-25 | Disposition: A | Discharge status: Home / Self Care | Payer: 59 | Source: Ambulatory Visit | Attending: Family Medicine | Admitting: Family Medicine

## 2023-06-25 DIAGNOSIS — N50812 Left testicular pain: Secondary | ICD-10-CM | POA: Insufficient documentation

## 2023-06-27 ENCOUNTER — Other Ambulatory Visit: Payer: Self-pay | Admitting: Family Medicine

## 2023-06-27 DIAGNOSIS — N50812 Left testicular pain: Secondary | ICD-10-CM

## 2023-07-04 ENCOUNTER — Other Ambulatory Visit: Payer: Self-pay | Admitting: Family Medicine

## 2023-07-04 DIAGNOSIS — B372 Candidiasis of skin and nail: Secondary | ICD-10-CM

## 2023-07-04 MED ORDER — KETOCONAZOLE 2 % EX CREA: 1.0000 | TOPICAL_CREAM | Freq: Every day | CUTANEOUS | 0 refills | Status: DC

## 2023-07-04 NOTE — Telephone Encounter (Signed)
Copied from CRM [id number]. Topic: Clinical - Medication Refill >> Jul 04, 2023  1:21 PM [place] J wrote: Most Recent Primary Care Visit:  Provider: Birdie Sons, ERIC G  Department: [facility]  Visit Type: PHYSICAL/AWV  Date: 06/16/2023  Medication: ketoconazole (NIZORAL) 2 % cream  Has the patient contacted their pharmacy? Yes (Agent: If no, request that the patient contact the pharmacy for the refill. If patient does not wish to contact the pharmacy document the reason why and proceed with request.) (Agent: If yes, when and what did the pharmacy advise?)  Is this the correct pharmacy for this prescription? Yes If no, delete pharmacy and type the correct one.  This is the patient's preferred pharmacy:  [facility] [address], [place] - [address] [place] [address] Phone: [phone] Fax: [phone]  [id number] [place] [facility] - [address] [place] [address] Phone: [phone] Fax: [phone]   Has the prescription been filled recently? Yes  Is the patient out of the medication? Yes  Has the patient been seen for an appointment in the last year OR does the patient have an upcoming appointment? Yes  Can we respond through MyChart? Yes  Agent: Please be advised that Rx refills may take up to 3 business days. We ask that you follow-up with your pharmacy.

## 2023-07-17 ENCOUNTER — Telehealth: Admitting: Family Medicine

## 2023-07-17 DIAGNOSIS — J111 Influenza due to unidentified influenza virus with other respiratory manifestations: Secondary | ICD-10-CM

## 2023-07-17 MED ORDER — OSELTAMIVIR PHOSPHATE 75 MG PO CAPS: 75.0000 mg | ORAL_CAPSULE | Freq: Two times a day (BID) | ORAL | 0 refills | Status: AC

## 2023-07-17 NOTE — Progress Notes (Signed)

## 2023-07-18 ENCOUNTER — Other Ambulatory Visit (HOSPITAL_COMMUNITY): Payer: Self-pay

## 2023-07-31 ENCOUNTER — Telehealth: Payer: Self-pay | Admitting: "Endocrinology

## 2023-07-31 ENCOUNTER — Ambulatory Visit (INDEPENDENT_AMBULATORY_CARE_PROVIDER_SITE_OTHER): Payer: 59 | Admitting: Urology

## 2023-07-31 ENCOUNTER — Other Ambulatory Visit: Payer: Self-pay | Admitting: "Endocrinology

## 2023-07-31 ENCOUNTER — Encounter: Payer: Self-pay | Admitting: Urology

## 2023-07-31 VITALS — BP 132/82 | HR 83 | Ht 72.0 in | Wt 269.1 lb

## 2023-07-31 DIAGNOSIS — E291 Testicular hypofunction: Secondary | ICD-10-CM

## 2023-07-31 DIAGNOSIS — N50812 Left testicular pain: Secondary | ICD-10-CM

## 2023-07-31 DIAGNOSIS — Q5522 Retractile testis: Secondary | ICD-10-CM | POA: Diagnosis not present

## 2023-07-31 LAB — URINALYSIS, COMPLETE
Bilirubin, UA: NEGATIVE
Glucose, UA: NEGATIVE
Leukocytes,UA: NEGATIVE
Nitrite, UA: NEGATIVE
RBC, UA: NEGATIVE
Specific Gravity, UA: >1.03 — ABNORMAL HIGH (ref 1.005–1.030)
Urobilinogen, Ur: 0.2 mg/dL (ref 0.2–1.0)
pH, UA: 5.5 (ref 5.0–7.5)

## 2023-07-31 LAB — MICROSCOPIC EXAMINATION
Bacteria, UA: NONE SEEN
Epithelial Cells (non renal): NONE SEEN /HPF (ref 0–10)
RBC, Urine: NONE SEEN /HPF (ref 0–2)

## 2023-07-31 MED ORDER — TESTOSTERONE CYPIONATE 100 MG/ML IM SOLN: 100.0000 mg | INTRAMUSCULAR | 0 refills | Status: DC

## 2023-07-31 NOTE — Telephone Encounter (Signed)
 Pt wants to know why his prescription changed, said he was getting a bigger vile now its only like a few doses.

## 2023-07-31 NOTE — Telephone Encounter (Signed)
 Pt needs prescription for testosterone sent to [facility].

## 2023-07-31 NOTE — Progress Notes (Signed)
 Marcelle Overlie Plume,acting as a scribe for Vanna Scotland, MD.,have documented all relevant documentation on the behalf of Vanna Scotland, MD,as directed by  Vanna Scotland, MD while in the presence of Vanna Scotland, MD.  07/31/23 10:21 AM   Cody Kramer [birth year] [id number]  Referring provider: Glori Luis, MD No address on file  Chief Complaint  Patient presents with   Establish Care   Testicle Pain    HPI:  33 y/o male referred for further evaluation of left testicular scrotal pain. He had seen Dr. Birdie Sons in early February when he was initially experiencing inconsistent, intermittent left testicular pain for three weeks, with occasional retraction of the left testicle. His exam at the time noted slightly higher riding, left testicle, as opposed to the right, but otherwise negative. An ultrasound performed on 06/25/2023 was completely normal. I personally reviewed that study.   Recently, he has noticed that the discomfort has extended to both testicles, with the left testicle retracting more frequently. He reports that the retraction occurs randomly and sometimes during sexual activity, causing the testicles to 'disappear.' He denies any bulging or pain in the groin area, and there is no history of trauma. He is concerned about the possibility of a hernia or other underlying issues.   He has a personal history of hypogonadism and is managed on testosterone injections by endocrinology. He is also worried about potential reproductive issues due to testosterone therapy, as he and his partner are considering having another child. He is interested in exploring alternative options to testosterone injections, such as Clomid, to manage his hypogonadism while preserving fertility.   PMH: Past Medical History:  Diagnosis Date   Asthma    Paroxysmal atrial fibrillation [facility])     Surgical History: Past Surgical History:  Procedure Laterality Date   TONSILLECTOMY      TOOTH EXTRACTION  06/2022    Home Medications:  Allergies as of 07/31/2023       Reactions   Coconut (cocos Nucifera) Swelling   Amoxicillin Hives, Other (See Comments)   Unsure of reaction, "was a long time ago." Unsure of reaction, "was a long time ago."        Medication List        Accurate as of July 31, 2023 10:21 AM. If you have any questions, ask your nurse or doctor.          ibuprofen 600 MG tablet Commonly known as: ADVIL Take 600 mg by mouth every 6 (six) hours as needed. for pain   ketoconazole 2 % cream Commonly known as: NIZORAL Apply 1 Application topically daily.   MULTIVITAMIN ADULT PO Take 1 tablet by mouth daily.   SYRINGE-NEEDLE (DISP) 3 ML 21G X 1-1/2" 3 ML Misc Use to inject testosterone every week   testosterone cypionate 100 MG/ML injection Commonly known as: DEPOTESTOTERONE CYPIONATE Inject 1 mL (100 mg total) into the muscle every 7 (seven) days. For IM use only        Allergies:  Allergies  Allergen Reactions   Coconut (Cocos Nucifera) Swelling   Amoxicillin Hives and Other (See Comments)    Unsure of reaction, "was a long time ago." Unsure of reaction, "was a long time ago."     Family History: Family History  Problem Relation Age of Onset   Hypertension Mother    Healthy Mother    Heart disease Mother    Hyperlipidemia Mother    Healthy Father    COPD Maternal Grandfather  Social History:  reports that he has quit smoking. He uses smokeless tobacco. He reports that he does not currently use alcohol. He reports that he does not use drugs.   Physical Exam: BP 132/82   Pulse 83   Ht 6' (1.829 m)   Wt 269 lb 2 oz (122.1 kg)   BMI 36.50 kg/m   Constitutional:  Alert and oriented, No acute distress. HEENT: [place] AT, moist mucus membranes.  Trachea midline, no masses. GU: High cremasteric tone noted with relatively tight scrotum.  Testicles are able to be pulled down, left side higher than the right.  Testicles  otherwise are normal, no masses or lesions.  Nontender., no hernia detected upon examination. Neurologic: Grossly intact, no focal deficits, moving all 4 extremities. Psychiatric: Normal mood and affect.  Pertinent Imaging: PROCEDURE: ULTRASOUND SCROTUM DOPPLER COMPLETE   HISTORY: Patient is a 33 y/o  M with left scrotal pain for 1 month.   COMPARISON: None available.   TECHNIQUE: Two-dimensional grayscale, color and spectral Doppler ultrasound of the scrotum and testicles was performed.   FINDINGS: The right testicle measures 4.5 x 3.4 x 2.3 cm demonstrates a normal echotexture with normal color and spectral Doppler flow. There are no intratesticular masses or calcifications. The right epididymal head measures 2.5 x 0.8 x 0.7 cm and demonstrates a normal echotexture.   The left testicle measures 4.0 x 3.6 x 1.8 cm demonstrates a normal echotexture with normal color and spectral Doppler flow. There are no intratesticular masses or calcifications. The left epididymal head measures 1.5 x 0.8 x 0.4 cm and demonstrates a normal echotexture.   There are no hydroceles or varicoceles.   IMPRESSION: 1. Unremarkable ultrasound of the scrotum.   2.  Normal spectral Doppler evaluation of the testicles.   Thank you for allowing [place] to assist in the care of this patient.     Electronically Signed   By: Lestine Box M.D.   On: 06/25/2023 21:18   This was personally reviewed and I agree with the radiologic interpretation.  Assessment & Plan:    1. Retractile Testicle - He reports intermittent retraction of the testicles, more pronounced on the left side, with associated discomfort.  - Examination did not reveal any hernia or other abnormalities. The condition is likely due to high cremasteric muscle tone.  - Surgical intervention to release muscle tension is an option, but lifestyle modifications, including weight loss, are recommended as a first step.  - He is advised to consider  consulting with Dr. Lafonda Mosses in [facility] for further surgical evaluation if symptoms persist or worsen.  I will reach out to him personally to ensure that he does manage these patients thank you.  I did reach out to him personally today and he mentioned that especially for patients to have pain during intercourse, cremasteric relief can be effective.  2. Hypogonadism on Testosterone Therapy - He is currently on testosterone injections, which may impact fertility.  - It is recommended to discuss alternative treatments such as Clomid with the endocrinologist to potentially preserve fertility.  - He is informed about the impact of testosterone on sperm quality and volume and advised to consider discontinuing testosterone if planning for more children.  - Information about Clomid will be provided, and he is encouraged to discuss this with the endocrinologist.  F/u prn  I have reviewed the above documentation for accuracy and completeness, and I agree with the above.   Vanna Scotland, MD    [facility] [phone]  [address] [place], [place] [address] [phone]

## 2023-07-31 NOTE — Telephone Encounter (Signed)
 Spoke with pt, understanding voiced and stated he did receive a message from the pharmacy.

## 2023-07-31 NOTE — Addendum Note (Signed)
 Addended by: Consuella Lose on: 07/31/2023 04:42 PM   Modules accepted: Orders

## 2023-07-31 NOTE — Patient Instructions (Signed)
 Clomiphene Tablets What is this medication? CLOMIPHENE (KLOE mi feen) treats irregular or absent ovulation in people trying to get pregnant. It works by Systems analyst release an egg (ovulation), which increases the chance of pregnancy. This medicine may be used for other purposes; ask your health care provider or pharmacist if you have questions. COMMON BRAND NAME(S): Clomid, Serophene What should I tell my care team before I take this medication? They need to know if you have any of these conditions: Adrenal gland disease Blood vessel disease or blood clots Cyst on the ovary Endometriosis Liver disease Ovarian cancer Pituitary gland disease Vaginal bleeding that has not been evaluated An unusual or allergic reaction to clomiphene, other medications, foods, dyes, or preservatives Pregnant (should not be used if you are already pregnant) Breastfeeding How should I use this medication? Take this medication by mouth with a glass of water. Take it as directed on the prescription label. Take exactly as directed for the exact number of days prescribed. This medication is usually taken for a 5-day period, but the length of treatment may be adjusted. Your care team will give you a start date for this medication and will give you instructions on proper use. Do not take your medication more often than directed. Talk to your care team about the use of this medication in children. Special care may be needed. Overdosage: If you think you have taken too much of this medicine contact a poison control center or emergency room at once. NOTE: This medicine is only for you. Do not share this medicine with others. What if I miss a dose? If you miss a dose, take it as soon as you can. If it is almost time for your next dose, take only that dose. Do not take double or extra doses. What may interact with this medication? Herbal or dietary supplements, such as blue cohosh, black cohosh, chasteberry, or  DHEA Prasterone This list may not describe all possible interactions. Give your health care provider a list of all the medicines, herbs, non-prescription drugs, or dietary supplements you use. Also tell them if you smoke, drink alcohol, or use illegal drugs. Some items may interact with your medicine. What should I watch for while using this medication? Make sure you understand how and when to use this medication. You need to know when you are ovulating and when to have sexual intercourse. This will increase the chance of a pregnancy. Visit your care team for regular checks on your progress. You may need tests to check the hormone levels in your blood, or you may have to use home-urine tests to check for ovulation. Try to keep any appointments. Compared to other fertility treatments, this medication does not greatly increase your chances of having multiple babies. An increased chance of having twins may occur in roughly 5 out of every 100 women who take this medication. Stop taking this medication at once and contact your care team if you think you are pregnant. This medication is not for long-term use. Most women that benefit from this medication do so within the first three cycles (months). Your care team will monitor your condition. This medication is usually used for a total of 6 cycles of treatment. This medication may affect your coordination, reaction time, or judgment. Do not drive or operate machinery until you know how this medication affects you. Sit up or stand slowly to reduce the risk of dizzy or fainting spells. Drinking alcohol with this medication can increase the risk  of these side effects. Drinking alcoholic beverages or smoking tobacco may decrease your chance of becoming pregnant. Limit or stop alcohol and tobacco use during your fertility treatments. What side effects may I notice from receiving this medication? Side effects that you should report to your care team as soon as  possible: Allergic reactions--skin rash, itching, hives, swelling of the face, lips, tongue, or throat Change in vision Ovarian hyperstimulation syndrome--stomach or pelvic pain, bloating, nausea, vomiting, diarrhea, weight gain Pancreatitis--severe stomach pain that spreads to your back or gets worse after eating or when touched, fever, nausea, vomiting Side effects that usually do not require medical attention (report to your care team if they continue or are bothersome): Breast pain or tenderness Headache Hot flashes Irregular menstrual cycles or spotting This list may not describe all possible side effects. Call your doctor for medical advice about side effects. You may report side effects to FDA at 1-800-FDA-1088. Where should I keep my medication? Keep out of the reach of children and pets. Store between 15 and 30 degrees C (59 and 86 degrees F). Protect from heat, light, and moisture. Get rid of any unused medication after the expiration date. To get rid of medications that are no longer needed or have expired: Take the medication to a medication take-back program. Check with your pharmacy or law enforcement to find a location. If you cannot return the medication, check the label or package insert to see if the medication should be thrown out in the garbage or flushed down the toilet. If you are not sure, ask your care team. If it is safe to put it in the trash, empty the medication out of the container. Mix the medication with cat litter, dirt, coffee grounds, or other unwanted substance. Seal the mixture in a bag or container. Put it in the trash. NOTE: This sheet is a summary. It may not cover all possible information. If you have questions about this medicine, talk to your doctor, pharmacist, or health care provider.  2024 Elsevier/Gold Standard (2021-11-02 00:00:00)

## 2023-08-05 NOTE — Telephone Encounter (Signed)
 Spoke with patient and he wanted to know if he needs to do anything in regards to the mucus that showed up in his UA, when he googled it it showed possible cancer, UTI, std, etc. . Advised I would let Dr Apolinar Junes review.

## 2023-08-07 ENCOUNTER — Telehealth: Payer: Self-pay

## 2023-08-07 NOTE — Telephone Encounter (Signed)
 Pt called stating recently he and his wife have been discussing and considering having more children and are concerned about pt taking testosterone injections. States he was seen by his urologist last week and she suggested he talk with you about Clomid. Pt's urology note is in epic.

## 2023-08-08 NOTE — Telephone Encounter (Signed)
 Moved appt to 08/28/23, does he need to do his labs before then?

## 2023-08-08 NOTE — Telephone Encounter (Signed)
 Spoke with pt advising him Dr.Nida approves of the use of Clomid but he would need to discontinue Testosterone to facilitate their chance of conception per Dr.Nida. Pt voiced understanding.

## 2023-08-08 NOTE — Telephone Encounter (Signed)
 Sent pt MyChart Message to do labs

## 2023-08-08 NOTE — Telephone Encounter (Signed)
 Yeah, go ahead and have him do his labs.

## 2023-08-19 LAB — LIPID PANEL
Chol/HDL Ratio: 3.5 ratio (ref 0.0–5.0)
Cholesterol, Total: 154 mg/dL (ref 100–199)
HDL: 44 mg/dL (ref >39)
LDL Chol Calc (NIH): 98 mg/dL (ref 0–99)
Triglycerides: 58 mg/dL (ref 0–149)
VLDL Cholesterol Cal: 12 mg/dL (ref 5–40)

## 2023-08-19 LAB — TESTOSTERONE, FREE, TOTAL, SHBG
Sex Hormone Binding: 14.8 nmol/L — ABNORMAL LOW (ref 16.5–55.9)
Testosterone, Free: 24.1 pg/mL (ref 8.7–25.1)
Testosterone: 691 ng/dL (ref 264–916)

## 2023-08-19 LAB — PSA: Prostate Specific Ag, Serum: 0.3 ng/mL (ref 0.0–4.0)

## 2023-08-28 ENCOUNTER — Ambulatory Visit: Admitting: "Endocrinology

## 2023-09-24 ENCOUNTER — Telehealth: Admitting: Physician Assistant

## 2023-09-24 DIAGNOSIS — J208 Acute bronchitis due to other specified organisms: Secondary | ICD-10-CM | POA: Diagnosis not present

## 2023-09-24 DIAGNOSIS — B9689 Other specified bacterial agents as the cause of diseases classified elsewhere: Secondary | ICD-10-CM | POA: Diagnosis not present

## 2023-09-24 MED ORDER — PSEUDOEPH-BROMPHEN-DM 30-2-10 MG/5ML PO SYRP: 5.0000 mL | ORAL_SOLUTION | Freq: Four times a day (QID) | ORAL | 0 refills | Status: DC | PRN

## 2023-09-24 MED ORDER — AZITHROMYCIN 250 MG PO TABS: ORAL_TABLET | ORAL | 0 refills | Status: AC

## 2023-09-24 NOTE — Patient Instructions (Signed)
 Cathye Coca, thank you for joining Angelia Kelp, PA-C for today's virtual visit.  While this provider is not your primary care provider (PCP), if your PCP is located in our provider database this encounter information will be shared with them immediately following your visit.   A [facility] MyChart account gives you access to today's visit and all your visits, tests, and labs performed at [facility] " click here if you don't have a [facility] MyChart account or go to [email or web address]  Consent: (Patient) Cody Kramer provided verbal consent for this virtual visit at the beginning of the encounter.  Current Medications:  Current Outpatient Medications:    azithromycin  (ZITHROMAX ) 250 MG tablet, Take 2 tablets on day 1, then 1 tablet daily on days 2 through 5, Disp: 6 tablet, Rfl: 0   brompheniramine-pseudoephedrine-DM 30-2-10 MG/5ML syrup, Take 5 mLs by mouth 4 (four) times daily as needed., Disp: 120 mL, Rfl: 0   ibuprofen (ADVIL) 600 MG tablet, Take 600 mg by mouth every 6 (six) hours as needed. for pain, Disp: , Rfl:    ketoconazole  (NIZORAL ) 2 % cream, Apply 1 Application topically daily., Disp: 15 g, Rfl: 0   Multiple Vitamin (MULTIVITAMIN ADULT PO), Take 1 tablet by mouth daily., Disp: , Rfl:    SYRINGE-NEEDLE, DISP, 3 ML 21G X 1-1/2" 3 ML MISC, Use to inject testosterone  every week, Disp: 50 each, Rfl: 1   testosterone  cypionate (DEPOTESTOTERONE CYPIONATE) 100 MG/ML injection, Inject 1 mL (100 mg total) into the muscle every 7 (seven) days. For IM use only, Disp: 10 mL, Rfl: 0   Medications ordered in this encounter:  Meds ordered this encounter  Medications   azithromycin  (ZITHROMAX ) 250 MG tablet    Sig: Take 2 tablets on day 1, then 1 tablet daily on days 2 through 5    Dispense:  6 tablet    Refill:  0    Supervising Provider:   LAMPTEY, PHILIP O [[id number]]   brompheniramine-pseudoephedrine-DM 30-2-10 MG/5ML syrup    Sig: Take 5 mLs  by mouth 4 (four) times daily as needed.    Dispense:  120 mL    Refill:  0    Supervising Provider:   Corine Dice [[id number]]     *If you need refills on other medications prior to your next appointment, please contact your pharmacy*  Follow-Up: Call back or seek an in-person evaluation if the symptoms worsen or if the condition fails to improve as anticipated.  [facility] [phone]  Other Instructions Acute Bronchitis, Adult  Acute bronchitis is sudden inflammation of the main airways (bronchi) that come off the windpipe (trachea) in the lungs. The swelling causes the airways to get smaller and make more mucus than normal. This can make it hard to breathe and can cause coughing or noisy breathing (wheezing). Acute bronchitis may last several weeks. The cough may last longer. Allergies, asthma, and exposure to smoke may make the condition worse. What are the causes? This condition can be caused by germs and by substances that irritate the lungs, including: Cold and flu viruses. The most common cause of this condition is the virus that causes the common cold. Bacteria. This is less common. Breathing in substances that irritate the lungs, including: Smoke from cigarettes and other forms of tobacco. Dust and pollen. Fumes from household cleaning products, gases, or burned fuel. Indoor or outdoor air pollution. What increases the risk? The following factors may make you more likely to  develop this condition: A weak body's defense system, also called the immune system. A condition that affects your lungs and breathing, such as asthma. What are the signs or symptoms? Common symptoms of this condition include: Coughing. This may bring up clear, yellow, or green mucus from your lungs (sputum). Wheezing. Runny or stuffy nose. Having too much mucus in your lungs (chest congestion). Shortness of breath. Aches and pains, including sore throat or chest. How is this  diagnosed? This condition is usually diagnosed based on: Your symptoms and medical history. A physical exam. You may also have other tests, including tests to rule out other conditions, such as pneumonia. These tests include: A test of lung function. Test of a mucus sample to look for the presence of bacteria. Tests to check the oxygen level in your blood. Blood tests. Chest X-ray. How is this treated? Most cases of acute bronchitis clear up over time without treatment. Your health care provider may recommend: Drinking more fluids to help thin your mucus so it is easier to cough up. Taking inhaled medicine (inhaler) to improve air flow in and out of your lungs. Using a vaporizer or a humidifier. These are machines that add water to the air to help you breathe better. Taking a medicine that thins mucus and clears congestion (expectorant). Taking a medicine that prevents or stops coughing (cough suppressant). It is not common to take an antibiotic medicine for this condition. Follow these instructions at home:  Take over-the-counter and prescription medicines only as told by your health care provider. Use an inhaler, vaporizer, or humidifier as told by your health care provider. Take two teaspoons (10 mL) of honey at bedtime to lessen coughing at night. Drink enough fluid to keep your urine pale yellow. Do not use any products that contain nicotine or tobacco. These products include cigarettes, chewing tobacco, and vaping devices, such as e-cigarettes. If you need help quitting, ask your health care provider. Get plenty of rest. Return to your normal activities as told by your health care provider. Ask your health care provider what activities are safe for you. Keep all follow-up visits. This is important. How is this prevented? To lower your risk of getting this condition again: Wash your hands often with soap and water for at least 20 seconds. If soap and water are not available, use  hand sanitizer. Avoid contact with people who have cold symptoms. Try not to touch your mouth, nose, or eyes with your hands. Avoid breathing in smoke or chemical fumes. Breathing smoke or chemical fumes will make your condition worse. Get the flu shot every year. Contact a health care provider if: Your symptoms do not improve after 2 weeks. You have trouble coughing up the mucus. Your cough keeps you awake at night. You have a fever. Get help right away if you: Cough up blood. Feel pain in your chest. Have severe shortness of breath. Faint or keep feeling like you are going to faint. Have a severe headache. Have a fever or chills that get worse. These symptoms may represent a serious problem that is an emergency. Do not wait to see if the symptoms will go away. Get medical help right away. Call your local emergency services (911 in the [place] Do not drive yourself to the hospital. Summary Acute bronchitis is inflammation of the main airways (bronchi) that come off the windpipe (trachea) in the lungs. The swelling causes the airways to get smaller and make more mucus than normal. Drinking more fluids can  help thin your mucus so it is easier to cough up. Take over-the-counter and prescription medicines only as told by your health care provider. Do not use any products that contain nicotine or tobacco. These products include cigarettes, chewing tobacco, and vaping devices, such as e-cigarettes. If you need help quitting, ask your health care provider. Contact a health care provider if your symptoms do not improve after 2 weeks. This information is not intended to replace advice given to you by your health care provider. Make sure you discuss any questions you have with your health care provider. Document Revised: 08/09/2021 Document Reviewed: 08/30/2020 Elsevier Patient Education  2024 Elsevier Inc.   If you have been instructed to have an in-person evaluation today at a local Urgent Care  facility, please use the link below. It will take you to a list of all of our available [facility], including address, phone number and hours of operation. Please do not delay care.  [facility]  If you or a family member do not have a primary care provider, use the link below to schedule a visit and establish care. When you choose a [facility] primary care physician or advanced practice provider, you gain a long-term partner in health. Find a Primary Care Provider  Learn more about [place]'s in-office and virtual care options: [place] [place] - Get Care Now

## 2023-09-24 NOTE — Progress Notes (Signed)
 Virtual Visit Consent   Cody Kramer, you are scheduled for a virtual visit with a [facility] provider today. Just as with appointments in the office, your consent must be obtained to participate. Your consent will be active for this visit and any virtual visit you may have with one of our providers in the next 365 days. If you have a MyChart account, a copy of this consent can be sent to you electronically.  As this is a virtual visit, video technology does not allow for your provider to perform a traditional examination. This may limit your provider's ability to fully assess your condition. If your provider identifies any concerns that need to be evaluated in person or the need to arrange testing (such as labs, EKG, etc.), we will make arrangements to do so. Although advances in technology are sophisticated, we cannot ensure that it will always work on either your end or our end. If the connection with a video visit is poor, the visit may have to be switched to a telephone visit. With either a video or telephone visit, we are not always able to ensure that we have a secure connection.  By engaging in this virtual visit, you consent to the provision of healthcare and authorize for your insurance to be billed (if applicable) for the services provided during this visit. Depending on your insurance coverage, you may receive a charge related to this service.  I need to obtain your verbal consent now. Are you willing to proceed with your visit today? Cody Kramer has provided verbal consent on 09/24/2023 for a virtual visit (video or telephone). Angelia Kelp, PA-C  Date: 09/24/2023 7:19 PM   Virtual Visit via Video Note   I, Angelia Kelp, connected with  Cody Kramer  ([id number], [birth year]) on 09/24/23 at  7:15 PM EDT by a video-enabled telemedicine application and verified that I am speaking with the correct person using two identifiers.  Location: Patient: Virtual  Visit Location Patient: Home Provider: Virtual Visit Location Provider: Home Office   I discussed the limitations of evaluation and management by telemedicine and the availability of in person appointments. The patient expressed understanding and agreed to proceed.    History of Present Illness: Cody Kramer is a 33 y.o. who identifies as a male who was assigned male at birth, and is being seen today for cough and congestion.  HPI: URI  This is a new problem. The maximum temperature recorded prior to his arrival was 100.4 - 100.9 F (100.7). The fever has been present for Less than 1 day. Associated symptoms include chest pain (ribs hurting from coughing), congestion, coughing, headaches and rhinorrhea (and post nasal drainage). Pertinent negatives include no diarrhea, ear pain, nausea, plugged ear sensation, sinus pain, sore throat, vomiting or wheezing. Associated symptoms comments: Chills and sweats. He has tried decongestant and antihistamine (tylenol severe cold and flu, mucinex) for the symptoms. The treatment provided no relief.     Problems:  Patient Active Problem List   Diagnosis Date Noted   Candidal intertrigo 06/16/2023   Pain in left testicle 06/16/2023   Anxiety 06/16/2023   Elevated blood pressure reading 06/16/2023   Hypogonadism, male 08/14/2022   Mixed hyperlipidemia 08/14/2022   Respiratory illness with fever 07/16/2022   Encounter for general adult medical examination with abnormal findings 06/04/2022   Morbid obesity (HCC) 06/04/2022   Tendinitis of left shoulder 04/24/2022   Hypersomnia 07/18/2019   Polyuria 07/18/2019   Adverse effect of  testosterone  07/18/2019   History of tobacco abuse 07/18/2019   Paroxysmal atrial fibrillation (HCC) 05/20/2019   Peripheral edema 05/07/2018    Allergies:  Allergies  Allergen Reactions   Coconut (Cocos Nucifera) Swelling   Amoxicillin Hives and Other (See Comments)    Unsure of reaction, "was a long time  ago." Unsure of reaction, "was a long time ago."    Medications:  Current Outpatient Medications:    azithromycin  (ZITHROMAX ) 250 MG tablet, Take 2 tablets on day 1, then 1 tablet daily on days 2 through 5, Disp: 6 tablet, Rfl: 0   brompheniramine-pseudoephedrine-DM 30-2-10 MG/5ML syrup, Take 5 mLs by mouth 4 (four) times daily as needed., Disp: 120 mL, Rfl: 0   ibuprofen (ADVIL) 600 MG tablet, Take 600 mg by mouth every 6 (six) hours as needed. for pain, Disp: , Rfl:    ketoconazole  (NIZORAL ) 2 % cream, Apply 1 Application topically daily., Disp: 15 g, Rfl: 0   Multiple Vitamin (MULTIVITAMIN ADULT PO), Take 1 tablet by mouth daily., Disp: , Rfl:    SYRINGE-NEEDLE, DISP, 3 ML 21G X 1-1/2" 3 ML MISC, Use to inject testosterone  every week, Disp: 50 each, Rfl: 1   testosterone  cypionate (DEPOTESTOTERONE CYPIONATE) 100 MG/ML injection, Inject 1 mL (100 mg total) into the muscle every 7 (seven) days. For IM use only, Disp: 10 mL, Rfl: 0  Observations/Objective: Patient is well-developed, well-nourished in no acute distress.  Resting comfortably at home.  Head is normocephalic, atraumatic.  No labored breathing.  Speech is clear and coherent with logical content.  Patient is alert and oriented at baseline.    Assessment and Plan: 1. Acute bacterial bronchitis (Primary) - azithromycin  (ZITHROMAX ) 250 MG tablet; Take 2 tablets on day 1, then 1 tablet daily on days 2 through 5  Dispense: 6 tablet; Refill: 0 - brompheniramine-pseudoephedrine-DM 30-2-10 MG/5ML syrup; Take 5 mLs by mouth 4 (four) times daily as needed.  Dispense: 120 mL; Refill: 0  - Worsening over a week despite OTC medications - Will treat with Z-pack and Bromfed DM - Can continue Mucinex (PLAIN) - Push fluids.  - Rest.  - Steam and humidifier can help - Seek in person evaluation if worsening or symptoms fail to improve    Follow Up Instructions: I discussed the assessment and treatment plan with the patient. The patient  was provided an opportunity to ask questions and all were answered. The patient agreed with the plan and demonstrated an understanding of the instructions.  A copy of instructions were sent to the patient via MyChart unless otherwise noted below.    The patient was advised to call back or seek an in-person evaluation if the symptoms worsen or if the condition fails to improve as anticipated.    Angelia Kelp, PA-C

## 2023-10-08 ENCOUNTER — Other Ambulatory Visit: Payer: Self-pay | Admitting: "Endocrinology

## 2023-10-08 ENCOUNTER — Telehealth: Payer: Self-pay | Admitting: "Endocrinology

## 2023-10-08 DIAGNOSIS — E291 Testicular hypofunction: Secondary | ICD-10-CM

## 2023-10-08 MED ORDER — TESTOSTERONE CYPIONATE 100 MG/ML IM SOLN: 100.0000 mg | INTRAMUSCULAR | 0 refills | Status: DC

## 2023-10-08 NOTE — Telephone Encounter (Signed)
 Left vm

## 2023-10-08 NOTE — Telephone Encounter (Signed)
 Pt is asking does he need to repeat labs for his upcoming appt. He did them in April  Also patient is asking for a refill on his Testosterone  [facility] [address]

## 2023-10-10 LAB — TESTOSTERONE, FREE, TOTAL, SHBG
Sex Hormone Binding: 14.2 nmol/L — ABNORMAL LOW (ref 16.5–55.9)
Testosterone, Free: 24.5 pg/mL (ref 8.7–25.1)
Testosterone: 577 ng/dL (ref 264–916)

## 2023-10-14 ENCOUNTER — Ambulatory Visit: Payer: 59 | Admitting: "Endocrinology

## 2023-10-14 ENCOUNTER — Encounter: Payer: Self-pay | Admitting: "Endocrinology

## 2023-10-14 ENCOUNTER — Ambulatory Visit (INDEPENDENT_AMBULATORY_CARE_PROVIDER_SITE_OTHER): Admitting: "Endocrinology

## 2023-10-14 VITALS — BP 120/84 | HR 68 | Ht 72.0 in | Wt 266.0 lb

## 2023-10-14 DIAGNOSIS — E291 Testicular hypofunction: Secondary | ICD-10-CM | POA: Diagnosis not present

## 2023-10-14 DIAGNOSIS — E782 Mixed hyperlipidemia: Secondary | ICD-10-CM | POA: Diagnosis not present

## 2023-10-14 NOTE — Progress Notes (Signed)
 10/14/2023                         Endocrinology follow-up note   Cody Kramer, 33 y.o., male   Chief Complaint  Patient presents with   Follow-up    Hypogonadism, male     Past Medical History:  Diagnosis Date   Asthma    Paroxysmal atrial fibrillation [facility])    Past Surgical History:  Procedure Laterality Date   TONSILLECTOMY     TOOTH EXTRACTION  06/2022   Social History   Socioeconomic History   Marital status: Married    Spouse name: Not on file   Number of children: Not on file   Years of education: Not on file   Highest education level: Not on file  Occupational History   Not on file  Tobacco Use   Smoking status: Former   Smokeless tobacco: Current  Vaping Use   Vaping status: Every Day  Substance and Sexual Activity   Alcohol use: Not Currently   Drug use: Never   Sexual activity: Yes    Partners: Female    Birth control/protection: Condom  Other Topics Concern   Not on file  Social History Narrative   Not on file   Social Drivers of Health   Financial Resource Strain: Not on file  Food Insecurity: Not on file  Transportation Needs: Not on file  Physical Activity: Not on file  Stress: Not on file  Social Connections: Not on file   Outpatient Encounter Medications as of 10/14/2023  Medication Sig   ibuprofen (ADVIL) 600 MG tablet Take 600 mg by mouth every 6 (six) hours as needed. for pain   ketoconazole  (NIZORAL ) 2 % cream Apply 1 Application topically daily.   Multiple Vitamin (MULTIVITAMIN ADULT PO) Take 1 tablet by mouth daily.   SYRINGE-NEEDLE, DISP, 3 ML 21G X 1-1/2" 3 ML MISC Use to inject testosterone  every week   testosterone  cypionate (DEPOTESTOTERONE CYPIONATE) 100 MG/ML injection Inject 1 mL (100 mg total) into the muscle every 7 (seven) days. For IM use only   [DISCONTINUED] brompheniramine-pseudoephedrine-DM 30-2-10 MG/5ML syrup Take 5 mLs by mouth 4 (four) times daily as needed.   No  facility-administered encounter medications on file as of 10/14/2023.   ALLERGIES: Allergies  Allergen Reactions   Coconut (Cocos Nucifera) Swelling   Amoxicillin Hives and Other (See Comments)    Unsure of reaction, "was a long time ago." Unsure of reaction, "was a long time ago."     VACCINATION STATUS: Immunization History  Administered Date(s) Administered   Tdap 04/28/2018    HPI: Cody Kramer is a 33 y.o.-year-old man, being seen in follow up after he was seen in  consultation for evaluation and management of low testosterone  requested by by his   provider  Bair, Randa Burton, MD.  After appropriate workup confirmed hypogonadism likely related to AAS, he was initiated on testosterone  injections.  He is currently on 400 mg of IM testosterone  monthly.  He returns with significant improvement in his androgen levels with total testosterone  at 577 with favorable other labs.   He still has low SHBG.  He has better energy, stays active.  He presents with 30+ pounds of weight loss largely due to changing his diet based on lifestyle medicine nutrition.      He denies  trauma to testes,  chemotherapy,  testicular irradiation,  nor genitourinary surgery. No h/o cryptorchidism. He denies history of  mumps orchitis/ history of  autoimmune disorders. He reports to using androgens in an attempt to build his body when he was a teenager.   He grew and went through puberty like his peers. No personal history of  infertility - has  2 biological children. No incomplete/delayed sexual development.     No breast discomfort/gynecomastia. No abnormal sense of smell (only allergies). No hot flushes. No vision problems.  No report of changing headaches. No FH of hypogonadism/infertility . No Family history of hemochromatosis or pituitary tumors. No recent rapid weight change. No chronic diseases. No chronic pain. Not on opiates, does not take steroids.  No history of excessive alcohol intake. No herbal  medicines. Not on antidepressants. He has family history of heart failure and coronary artery disease in his mother.   ROS: Constitutional: + Presents with significant weight loss , no subjective hyperthermia/hypothermia    PE: BP 120/84   Pulse 68   Ht 6' (1.829 m)   Wt 266 lb (120.7 kg)   BMI 36.08 kg/m  Wt Readings from Last 3 Encounters:  10/14/23 266 lb (120.7 kg)  07/31/23 269 lb 2 oz (122.1 kg)  06/16/23 281 lb (127.5 kg)   Constitutional:  Body mass index is 36.08 kg/m.,  not in acute distress, + Normal State of Mind Eyes: PERRLA, EOMI, no exophthalmos ENT: moist mucous membranes, no thyromegaly, no cervical lymphadenopathy  Genital exam: normal male escutcheon, no inguinal LAD, normal phallus, testes ~25 mL, no testicular masses, no penile discharge.  No gynecomastia.   CMP ( most recent) CMP     Component Value Date/Time   NA 140 06/04/2022 0856   K 4.5 06/04/2022 0856   CL 105 06/04/2022 0856   CO2 25 06/04/2022 0856   GLUCOSE 94 06/04/2022 0856   BUN 21 06/04/2022 0856   CREATININE 0.99 06/04/2022 0856   CALCIUM 9.7 06/04/2022 0856   PROT 7.3 06/04/2022 0856   ALBUMIN 4.7 06/04/2022 0856   AST 15 06/04/2022 0856   ALT 33 06/04/2022 0856   ALKPHOS 56 06/04/2022 0856   BILITOT 0.4 06/04/2022 0856   GFRNONAA >60 12/24/2018 0037   GFRAA >60 12/24/2018 0037     Diabetic Labs (most recent): Lab Results  Component Value Date   HGBA1C 5.6 06/04/2022     Lipid Panel ( most recent) Lipid Panel     Component Value Date/Time   CHOL 154 08/14/2023 0730   TRIG 58 08/14/2023 0730   HDL 44 08/14/2023 0730   CHOLHDL 3.5 08/14/2023 0730   CHOLHDL 3 06/04/2022 0856   VLDL 13.4 06/04/2022 0856   LDLCALC 98 08/14/2023 0730   LABVLDL 12 08/14/2023 0730     Recent Results (from the past 2160 hours)  Urinalysis, Complete     Status: Abnormal   Collection Time: 07/31/23  8:37 AM  Result Value Ref Range   Specific Gravity, UA >1.030 (H) 1.005 - 1.030    pH, UA 5.5 5.0 - 7.5   Color, UA Yellow Yellow   Appearance Ur Clear Clear   Leukocytes,UA Negative Negative   Protein,UA Trace Negative/Trace   Glucose, UA Negative Negative   Ketones, UA Trace (A) Negative   RBC, UA Negative Negative   Bilirubin, UA Negative Negative   Urobilinogen, Ur 0.2 0.2 - 1.0 mg/dL   Nitrite, UA Negative Negative   Microscopic Examination See below:   Microscopic Examination     Status: Abnormal   Collection Time: 07/31/23  8:37 AM   Urine  Result  Value Ref Range   WBC, UA 0-5 0 - 5 /hpf   RBC, Urine None seen 0 - 2 /hpf   Epithelial Cells (non renal) None seen 0 - 10 /hpf   Mucus, UA Present (A) Not Estab.   Bacteria, UA None seen None seen/Few  Testosterone , Free, Total, SHBG     Status: Abnormal   Collection Time: 08/14/23  7:30 AM  Result Value Ref Range   Testosterone  691 264 - 916 ng/dL    Comment: Adult male reference interval is based on a population of healthy nonobese males (BMI <30) between 62 and 64 years old. Travison, et.al. JCEM [phone]. PMID: 91478295.    Testosterone , Free 24.1 8.7 - 25.1 pg/mL   Sex Hormone Binding 14.8 (L) 16.5 - 55.9 nmol/L  PSA     Status: None   Collection Time: 08/14/23  7:30 AM  Result Value Ref Range   Prostate Specific Ag, Serum 0.3 0.0 - 4.0 ng/mL    Comment: Roche ECLIA methodology. According to the American Urological Association, Serum PSA should decrease and remain at undetectable levels after radical prostatectomy. The AUA defines biochemical recurrence as an initial PSA value 0.2 ng/mL or greater followed by a subsequent confirmatory PSA value 0.2 ng/mL or greater. Values obtained with different assay methods or kits cannot be used interchangeably. Results cannot be interpreted as absolute evidence of the presence or absence of malignant disease.   Lipid panel     Status: None   Collection Time: 08/14/23  7:30 AM  Result Value Ref Range   Cholesterol, Total 154 100 - 199 mg/dL    Triglycerides 58 0 - 149 mg/dL   HDL 44 >62 mg/dL   VLDL Cholesterol Cal 12 5 - 40 mg/dL   LDL Chol Calc (NIH) 98 0 - 99 mg/dL   Chol/HDL Ratio 3.5 0.0 - 5.0 ratio    Comment:                                   T. Chol/HDL Ratio                                             Men  Women                               1/2 Avg.Risk  3.4    3.3                                   Avg.Risk  5.0    4.4                                2X Avg.Risk  9.6    7.1                                3X Avg.Risk 23.4   11.0   Testosterone , Free, Total, SHBG     Status: Abnormal   Collection Time: 10/09/23  8:02 AM  Result Value Ref Range   Testosterone  577 264 - 916 ng/dL    Comment:  Adult male reference interval is based on a population of healthy nonobese males (BMI <30) between 88 and 76 years old. Travison, et.al. JCEM [phone]. PMID: 91478295.    Testosterone , Free 24.5 8.7 - 25.1 pg/mL   Sex Hormone Binding 14.2 (L) 16.5 - 55.9 nmol/L     ASSESSMENT: 1. Hypogonadism 2.  Hyperlipidemia 3.  Obesity  PLAN:  I have reviewed and discussed his new and available lab results with him.  He is responding to his testosterone  replacement therapy.  He wishes to be continued on testosterone  replacement therapy. He is confirmed to have hypogonadism likely related to his previous use of androgens. His previsit labs are showing total testosterone  improving to range at 577 associated with still low SHBG.  He is advised to continue his testosterone   100 mg IM every week.  This will give him a total of 400 mg of testosterone  monthly.    - I have discussed potential causes of hypogonadism, diagnosis of hypogonadism,  and relevant workup to confirm the diagnosis of hypogonadism before initiating testosterone  replacement therapy.   -  I also discussed adverse effects of unnecessary testosterone  replacement short-term and long-term.  There is no indication of suspect secondary cause in the pituitary/sella.  Hence  no need for further imaging at this time.  - In this particular patient with bilaterally normal-sized testicles, his hypogonadism seems to be recent onset.  He is not planning for any fertility in the near future.  In light of his low SHBG which is likely related to his obesity.  He is also improving his cholesterol profile with LDL at 98, he remains a good candidate for lifestyle medicine.    He will continue to  benefit the most from weight loss.  I discussed lifestyle medicine interventions with him; recommended whole food plant-based diet, improve his sleep, manage stress, and exercise regularly.  -He will have fasting lipid panel along with his testosterone  next visit.   I advised him to maintain close follow-up with his PMD.   I spent  23  minutes in the care of the patient today including review of labs from Thyroid Function, CMP, and other relevant labs ; imaging/biopsy records (current and previous including abstractions from other facilities); face-to-face time discussing  his lab results and symptoms, medications doses, his options of short and long term treatment based on the latest standards of care / guidelines;   and documenting the encounter.  Cody Kramer  participated in the discussions, expressed understanding, and voiced agreement with the above plans.  All questions were answered to his satisfaction. he is encouraged to contact clinic should he have any questions or concerns prior to his return visit.    Return in about 4 months (around 02/13/2024) for Fasting Labs  in AM B4 8.  Kalvin Orf, MD [facility] [address] [place], [place] [address] Phone: [phone]  Fax: [phone]   10/14/2023, 10:53 AM  This note was partially dictated with voice recognition software. Similar sounding words can be transcribed inadequately or may not  be corrected upon review.

## 2023-10-15 ENCOUNTER — Encounter

## 2023-10-27 ENCOUNTER — Encounter

## 2023-10-27 ENCOUNTER — Telehealth: Admitting: Family Medicine

## 2023-10-27 DIAGNOSIS — E66812 Obesity, class 2: Secondary | ICD-10-CM

## 2023-10-27 DIAGNOSIS — Z79890 Hormone replacement therapy (postmenopausal): Secondary | ICD-10-CM

## 2023-10-27 DIAGNOSIS — J069 Acute upper respiratory infection, unspecified: Secondary | ICD-10-CM

## 2023-10-27 DIAGNOSIS — Z13220 Encounter for screening for lipoid disorders: Secondary | ICD-10-CM

## 2023-10-27 MED ORDER — DOXYCYCLINE HYCLATE 100 MG PO TABS: 100.0000 mg | ORAL_TABLET | Freq: Two times a day (BID) | ORAL | 0 refills | Status: AC

## 2023-10-27 MED ORDER — PROMETHAZINE-DM 6.25-15 MG/5ML PO SYRP: 5.0000 mL | ORAL_SOLUTION | Freq: Four times a day (QID) | ORAL | 0 refills | Status: AC | PRN

## 2023-10-27 NOTE — Progress Notes (Signed)
 Virtual Visit Consent   Cody Kramer, you are scheduled for a virtual visit with a [facility] provider today. Just as with appointments in the office, your consent must be obtained to participate. Your consent will be active for this visit and any virtual visit you may have with one of our providers in the next 365 days. If you have a MyChart account, a copy of this consent can be sent to you electronically.  As this is a virtual visit, video technology does not allow for your provider to perform a traditional examination. This may limit your provider's ability to fully assess your condition. If your provider identifies any concerns that need to be evaluated in person or the need to arrange testing (such as labs, EKG, etc.), we will make arrangements to do so. Although advances in technology are sophisticated, we cannot ensure that it will always work on either your end or our end. If the connection with a video visit is poor, the visit may have to be switched to a telephone visit. With either a video or telephone visit, we are not always able to ensure that we have a secure connection.  By engaging in this virtual visit, you consent to the provision of healthcare and authorize for your insurance to be billed (if applicable) for the services provided during this visit. Depending on your insurance coverage, you may receive a charge related to this service.  I need to obtain your verbal consent now. Are you willing to proceed with your visit today? Cody Kramer has provided verbal consent on 10/27/2023 for a virtual visit (video or telephone). Albertha Huger, FNP  Date: 10/27/2023 7:42 PM   Virtual Visit via Video Note   I, Albertha Huger, connected with  Cody Kramer  ([id number], Mar 22, 1991) on 10/27/23 at  7:45 PM EDT by a video-enabled telemedicine application and verified that I am speaking with the correct person using two identifiers.  Location: Patient: Home Provider: Virtual  Visit Location Provider: Home Office   I discussed the limitations of evaluation and management by telemedicine and the availability of in person appointments. The patient expressed understanding and agreed to proceed.    History of Present Illness: Cody Kramer is a 33 y.o. who identifies as a male who was assigned male at birth, and is being seen today for head congestion, post nasal congestion, cough, yellow mucus, no fever. No wheezing, no sob. Sx for 5-6 days worsening. Aaron Aas  HPI: HPI  Problems:  Patient Active Problem List   Diagnosis Date Noted   Candidal intertrigo 06/16/2023   Pain in left testicle 06/16/2023   Anxiety 06/16/2023   Elevated blood pressure reading 06/16/2023   Hypogonadism, male 08/14/2022   Mixed hyperlipidemia 08/14/2022   Respiratory illness with fever 07/16/2022   Encounter for general adult medical examination with abnormal findings 06/04/2022   Morbid obesity (HCC) 06/04/2022   Tendinitis of left shoulder 04/24/2022   Hypersomnia 07/18/2019   Polyuria 07/18/2019   Adverse effect of testosterone  07/18/2019   History of tobacco abuse 07/18/2019   Paroxysmal atrial fibrillation (HCC) 05/20/2019   Peripheral edema 05/07/2018    Allergies:  Allergies  Allergen Reactions   Coconut (Cocos Nucifera) Swelling   Amoxicillin Hives and Other (See Comments)    Unsure of reaction, was a long time ago. Unsure of reaction, was a long time ago.    Medications:  Current Outpatient Medications:    ibuprofen (ADVIL) 600 MG tablet, Take 600 mg by mouth  every 6 (six) hours as needed. for pain, Disp: , Rfl:    ketoconazole  (NIZORAL ) 2 % cream, Apply 1 Application topically daily., Disp: 15 g, Rfl: 0   Multiple Vitamin (MULTIVITAMIN ADULT PO), Take 1 tablet by mouth daily., Disp: , Rfl:    SYRINGE-NEEDLE, DISP, 3 ML 21G X 1-1/2 3 ML MISC, Use to inject testosterone  every week, Disp: 50 each, Rfl: 1   testosterone  cypionate (DEPOTESTOTERONE CYPIONATE) 100  MG/ML injection, Inject 1 mL (100 mg total) into the muscle every 7 (seven) days. For IM use only, Disp: 10 mL, Rfl: 0  Observations/Objective: Patient is well-developed, well-nourished in no acute distress.  Resting comfortably  at home.  Head is normocephalic, atraumatic.  No labored breathing.  Speech is clear and coherent with logical content.  Patient is alert and oriented at baseline.    Assessment and Plan: There are no diagnoses linked to this encounter. Increase fluids, humidifier at night, UC if sx worsen.   Follow Up Instructions: I discussed the assessment and treatment plan with the patient. The patient was provided an opportunity to ask questions and all were answered. The patient agreed with the plan and demonstrated an understanding of the instructions.  A copy of instructions were sent to the patient via MyChart unless otherwise noted below.   The patient was advised to call back or seek an in-person evaluation if the symptoms worsen or if the condition fails to improve as anticipated.    Elva Mauro, FNP

## 2023-10-27 NOTE — Patient Instructions (Signed)

## 2023-11-03 ENCOUNTER — Ambulatory Visit: Payer: Self-pay

## 2023-11-03 ENCOUNTER — Telehealth

## 2023-11-03 NOTE — Telephone Encounter (Signed)
 FYI, patient is scheduled to see the teleheath provider today at 5pm.

## 2023-11-03 NOTE — Telephone Encounter (Signed)
 FYI Only or Action Required?: Action required by provider: request for appointment. Doxycyline.Virtual Visit 10/27/23, pt. Now has sore throat.  Patient was last seen in primary care on 10/27/2023 by Blair, Diane W, FNP. Called Nurse Triage reporting Cough. Symptoms began today. Interventions attempted: Prescription medications: Doxycycline. Symptoms are: unchanged.  Triage Disposition: See Physician Within 24 Hours  Patient/caregiver understands and will follow disposition?: Yes  Pt. Need to goto UC per the practice.     Copied from CRM [id number]. Topic: Clinical - Red Word Triage >> Nov 03, 2023  8:47 AM Adelita E wrote: [facility] that prompted transfer to Nurse Triage: Wife called in to relay that patient was seen on 6/16 and is not doing any better. Cough, congestion, throat pain, and headaches. Reason for Disposition  [1] Continuous (nonstop) coughing interferes with work or school AND [2] no improvement using cough treatment per Care Advice  Answer Assessment - Initial Assessment Questions 1. ONSET: When did the cough begin?      10/26/23 2. SEVERITY: How bad is the cough today?      Severe 3. SPUTUM: Describe the color of your sputum (none, dry cough; clear, white, yellow, green)      4. HEMOPTYSIS: Are you coughing up any blood? If so ask: How much? (flecks, streaks, tablespoons, etc.)     no 5. DIFFICULTY BREATHING: Are you having difficulty breathing? If Yes, ask: How bad is it? (e.g., mild, moderate, severe)    - MILD: No SOB at rest, mild SOB with walking, speaks normally in sentences, can lie down, no retractions, pulse < 100.    - MODERATE: SOB at rest, SOB with minimal exertion and prefers to sit, cannot lie down flat, speaks in phrases, mild retractions, audible wheezing, pulse 100-120.    - SEVERE: Very SOB at rest, speaks in single words, struggling to breathe, sitting hunched forward, retractions, pulse > 120      no 6. FEVER: Do you have a fever? If  Yes, ask: What is your temperature, how was it measured, and when did it start?     no 7. CARDIAC HISTORY: Do you have any history of heart disease? (e.g., heart attack, congestive heart failure)      no 8. LUNG HISTORY: Do you have any history of lung disease?  (e.g., pulmonary embolus, asthma, emphysema)     no 9. PE RISK FACTORS: Do you have a history of blood clots? (or: recent major surgery, recent prolonged travel, bedridden)     no 10. OTHER SYMPTOMS: Do you have any other symptoms? (e.g., runny nose, wheezing, chest pain)       sOre throat, headache 11. PREGNANCY: Is there any chance you are pregnant? When was your last menstrual period?       N/a 12. TRAVEL: Have you traveled out of the country in the last month? (e.g., travel history, exposures)       no  Protocols used: Cough - Acute Productive-A-AH

## 2023-12-22 ENCOUNTER — Telehealth: Payer: Self-pay

## 2023-12-22 ENCOUNTER — Other Ambulatory Visit: Payer: Self-pay | Admitting: "Endocrinology

## 2023-12-22 ENCOUNTER — Other Ambulatory Visit (HOSPITAL_COMMUNITY): Payer: Self-pay

## 2023-12-22 MED ORDER — TESTOSTERONE CYPIONATE 100 MG/ML IM SOLN: 100.0000 mg | INTRAMUSCULAR | 0 refills | Status: DC

## 2023-12-22 NOTE — Telephone Encounter (Signed)
 Noted

## 2023-12-22 NOTE — Telephone Encounter (Signed)
 Pt called requesting a Rx refill for Testosterone .

## 2023-12-22 NOTE — Telephone Encounter (Signed)
 Pharmacy Patient Advocate Encounter   Received notification from CoverMyMeds that prior authorization for Testosterone  Cypionate 100MG /ML intramuscular solution is required/requested.   Insurance verification completed.   The patient is insured through [facility] [employer] [employer] .   Per test claim: PA required; PA submitted to above mentioned insurance via Latent Key/confirmation #/EOC [id number] Status is pending

## 2024-01-05 NOTE — Telephone Encounter (Signed)
 Pharmacy Patient Advocate Encounter  Received notification from [facility] that Prior Authorization for Testosterone  Cypionate 100MG /ML  has been APPROVED from 12/22/2023 to 12/22/2026   PA #/Case ID/Reference #: [id number]

## 2024-02-20 ENCOUNTER — Ambulatory Visit: Admitting: "Endocrinology

## 2024-03-04 ENCOUNTER — Telehealth: Payer: Self-pay | Admitting: "Endocrinology

## 2024-03-04 ENCOUNTER — Other Ambulatory Visit: Payer: Self-pay | Admitting: "Endocrinology

## 2024-03-04 MED ORDER — TESTOSTERONE CYPIONATE 100 MG/ML IM SOLN: 100.0000 mg | INTRAMUSCULAR | 0 refills | Status: DC

## 2024-03-04 NOTE — Telephone Encounter (Signed)
 Patient is requesting his refill on his   Disp Refills Start End   testosterone  cypionate (DEPOTESTOTERONE CYPIONATE) 100 MG/ML injection       He also would like the syringes to be sent in with it.  [facility] [address]

## 2024-03-10 ENCOUNTER — Telehealth

## 2024-03-10 DIAGNOSIS — L03113 Cellulitis of right upper limb: Secondary | ICD-10-CM

## 2024-03-10 DIAGNOSIS — B372 Candidiasis of skin and nail: Secondary | ICD-10-CM

## 2024-03-10 MED ORDER — KETOCONAZOLE 2 % EX CREA: 1.0000 | TOPICAL_CREAM | Freq: Every day | CUTANEOUS | 0 refills | Status: AC

## 2024-03-10 MED ORDER — DOXYCYCLINE HYCLATE 100 MG PO TABS: 100.0000 mg | ORAL_TABLET | Freq: Two times a day (BID) | ORAL | 0 refills | Status: DC

## 2024-03-10 MED ORDER — TRIAMCINOLONE ACETONIDE 0.1 % EX CREA: 1.0000 | TOPICAL_CREAM | Freq: Two times a day (BID) | CUTANEOUS | 0 refills | Status: AC

## 2024-03-10 MED ORDER — FLUCONAZOLE 150 MG PO TABS: 150.0000 mg | ORAL_TABLET | Freq: Once | ORAL | 0 refills | Status: AC

## 2024-03-10 NOTE — Patient Instructions (Signed)
 Cody Kramer, thank you for joining [name], PA-C for today's virtual visit.  While this provider is not your primary care provider (PCP), if your PCP is located in our provider database this encounter information will be shared with them immediately following your visit.   A [place] MyChart account gives you access to today's visit and all your visits, tests, and labs performed at [facility]  click here if you don't have a [facility] MyChart account or go to [email or web address]  Consent: (Patient) Cody Kramer provided verbal consent for this virtual visit at the beginning of the encounter.  Current Medications:  Current Outpatient Medications:    doxycycline  (VIBRA -TABS) 100 MG tablet, Take 1 tablet (100 mg total) by mouth 2 (two) times daily., Disp: 14 tablet, Rfl: 0   fluconazole (DIFLUCAN) 150 MG tablet, Take 1 tablet (150 mg total) by mouth once for 1 dose., Disp: 1 tablet, Rfl: 0   triamcinolone cream (KENALOG) 0.1 %, Apply 1 Application topically 2 (two) times daily., Disp: 30 g, Rfl: 0   ibuprofen (ADVIL) 600 MG tablet, Take 600 mg by mouth every 6 (six) hours as needed. for pain, Disp: , Rfl:    ketoconazole  (NIZORAL ) 2 % cream, Apply 1 Application topically daily., Disp: 15 g, Rfl: 0   Multiple Vitamin (MULTIVITAMIN ADULT PO), Take 1 tablet by mouth daily., Disp: , Rfl:    SYRINGE-NEEDLE, DISP, 3 ML 21G X 1-1/2 3 ML MISC, Use to inject testosterone  every week, Disp: 50 each, Rfl: 1   testosterone  cypionate (DEPOTESTOTERONE CYPIONATE) 100 MG/ML injection, Inject 1 mL (100 mg total) into the muscle every 7 (seven) days. For IM use only, Disp: 10 mL, Rfl: 0   Medications ordered in this encounter:  Meds ordered this encounter  Medications   doxycycline  (VIBRA -TABS) 100 MG tablet    Sig: Take 1 tablet (100 mg total) by mouth 2 (two) times daily.    Dispense:  14 tablet    Refill:  0    Supervising Provider:   LAMPTEY, PHILIP O  [[id number]]   triamcinolone cream (KENALOG) 0.1 %    Sig: Apply 1 Application topically 2 (two) times daily.    Dispense:  30 g    Refill:  0    Supervising Provider:   LAMPTEY, PHILIP O [[id number]]   ketoconazole  (NIZORAL ) 2 % cream    Sig: Apply 1 Application topically daily.    Dispense:  15 g    Refill:  0    Supervising Provider:   BLAISE ALEENE KIDD [[id number]]   fluconazole (DIFLUCAN) 150 MG tablet    Sig: Take 1 tablet (150 mg total) by mouth once for 1 dose.    Dispense:  1 tablet    Refill:  0    Supervising Provider:   LAMPTEY, PHILIP O [[id number]]     *If you need refills on other medications prior to your next appointment, please contact your pharmacy*  Follow-Up: Call back or seek an in-person evaluation if the symptoms worsen or if the condition fails to improve as anticipated.  [facility] [phone]  Other Instructions Cellulitis, Adult  Cellulitis is a skin infection. The infected area is usually warm, red, swollen, and tender. It most commonly occurs on the lower body, such as the legs, feet, and toes, but this condition can occur on any part of the body. The infection can travel to the muscles, blood, and underlying tissue and become life-threatening without treatment. It is  important to get medical treatment right away for this condition. What are the causes? Cellulitis is caused by bacteria. The bacteria enter through a break in the skin, such as a cut, burn, insect or animal bite, open sore, or crack. What increases the risk? This condition is more likely to occur in people who: Have a weak body's defense system (immune system). Are older than 35 years old. Have diabetes. Have a type of long-term (chronic) liver disease (cirrhosis) or kidney disease. Are obese. Have a skin condition such as: An itchy rash, such as eczema or psoriasis. A fungal rash on the feet or in skinfolds. Blistering rashes, such as shingles or chickenpox. Slow movement of  blood in the veins (venous stasis). Fluid buildup below the skin (edema). Have open wounds on the skin, such as cuts, puncture wounds, burns, bites, scrapes, tattoos, piercings, or wounds from surgery. Have had radiation therapy. Use IV drugs. What are the signs or symptoms? Symptoms of this condition include: Skin that looks red, purple, or slightly darker than your usual skin color. Streaks or spots on the skin. Swollen area of the skin. Tenderness or pain when an area of the skin is touched. Warm skin. Fever or chills. Blisters. Tiredness (fatigue). How is this diagnosed? This condition is diagnosed based on a medical history and physical exam. You may also have tests, including: Blood tests. Imaging tests. Tests on a sample of fluid taken from the wound (wound culture). How is this treated? Treatment for this condition may include: Medicines. These may include antibiotics or medicines to treat allergies (antihistamines). Rest. Applying cold or warm wet cloths (compresses) to the skin. If the condition is severe, you may need to stay in the hospital and get antibiotics through an IV. The infection usually starts to get better within 1-2 days of treatment. Follow these instructions at home: Medicines Take over-the-counter and prescription medicines only as told by your health care provider. If you were prescribed antibiotics, take them as told by your provider. Do not stop using the antibiotic even if you start to feel better. General instructions Drink enough fluid to keep your pee (urine) pale yellow. Do not touch or rub the infected area. Raise (elevate) the infected area above the level of your heart while you are sitting or lying down. Return to your normal activities as told by your provider. Ask your provider what activities are safe for you. Apply warm or cold compresses to the affected area as told by your provider. Keep all follow-up visits. Your provider will need to  make sure that a more serious infection is not developing. Contact a health care provider if: You have a fever. Your symptoms do not improve within 1-2 days of starting treatment or you develop new symptoms. Your bone or joint underneath the infected area becomes painful after the skin has healed. Your infection returns in the same area or another area. Signs of this may include: You notice a swollen bump in the infected area. Your red area gets larger, turns dark in color, or becomes more painful. Drainage increases. Pus or a bad smell develops in your infected area. You have more pain. You feel ill and have muscle aches and weakness. You develop vomiting or diarrhea that will not go away. Get help right away if: You notice red streaks coming from the infected area. You notice the skin turns purple or black and falls off. This symptom may be an emergency. Get help right away. Call 911. Do not  wait to see if the symptom will go away. Do not drive yourself to the hospital. This information is not intended to replace advice given to you by your health care provider. Make sure you discuss any questions you have with your health care provider. Document Revised: 12/25/2021 Document Reviewed: 12/25/2021 Elsevier Patient Education  2024 Elsevier Inc.   If you have been instructed to have an in-person evaluation today at a local Urgent Care facility, please use the link below. It will take you to a list of all of our available [facility], including address, phone number and hours of operation. Please do not delay care.  [facility]  If you or a family member do not have a primary care provider, use the link below to schedule a visit and establish care. When you choose a [facility] primary care physician or advanced practice provider, you gain a long-term partner in health. Find a Primary Care Provider  Learn more about [facility]'s in-office and virtual care  options: [facility] - Get Care Now

## 2024-03-10 NOTE — Progress Notes (Signed)
 Virtual Visit Consent   IRA DOUGHER, you are scheduled for a virtual visit with a [facility] provider today. Just as with appointments in the office, your consent must be obtained to participate. Your consent will be active for this visit and any virtual visit you may have with one of our providers in the next 365 days. If you have a MyChart account, a copy of this consent can be sent to you electronically.  As this is a virtual visit, video technology does not allow for your provider to perform a traditional examination. This may limit your provider's ability to fully assess your condition. If your provider identifies any concerns that need to be evaluated in person or the need to arrange testing (such as labs, EKG, etc.), we will make arrangements to do so. Although advances in technology are sophisticated, we cannot ensure that it will always work on either your end or our end. If the connection with a video visit is poor, the visit may have to be switched to a telephone visit. With either a video or telephone visit, we are not always able to ensure that we have a secure connection.  By engaging in this virtual visit, you consent to the provision of healthcare and authorize for your insurance to be billed (if applicable) for the services provided during this visit. Depending on your insurance coverage, you may receive a charge related to this service.  I need to obtain your verbal consent now. Are you willing to proceed with your visit today? Cody Kramer has provided verbal consent on 03/10/2024 for a virtual visit (video or telephone). Cody CHRISTELLA Dickinson, PA-C  Date: 03/10/2024 8:28 AM   Virtual Visit via Video Note   I, Cody Kramer, connected with  Cody Kramer  ([id number], Jul 16, 1990) on 03/10/24 at  7:45 AM EDT by a video-enabled telemedicine application and verified that I am speaking with the correct person using two identifiers.  Location: Patient:  Virtual Visit Location Patient: Home Provider: Virtual Visit Location Provider: Home Office   I discussed the limitations of evaluation and management by telemedicine and the availability of in person appointments. The patient expressed understanding and agreed to proceed.    History of Present Illness: Cody Kramer is a 33 y.o. who identifies as a male who was assigned male at birth, and is being seen today for rash.  HPI: Rash This is a new problem. The current episode started yesterday. The problem is unchanged. The affected locations include the right elbow. The rash is characterized by pain, redness and swelling. It is unknown if there was an exposure to a precipitant. Pertinent negatives include no fever. Treatments tried: ibuprofen. The treatment provided no relief.     Problems:  Patient Active Problem List   Diagnosis Date Noted   Candidal intertrigo 06/16/2023   Pain in left testicle 06/16/2023   Anxiety 06/16/2023   Elevated blood pressure reading 06/16/2023   Hypogonadism, male 08/14/2022   Mixed hyperlipidemia 08/14/2022   Respiratory illness with fever 07/16/2022   Encounter for general adult medical examination with abnormal findings 06/04/2022   Morbid obesity (HCC) 06/04/2022   Tendinitis of left shoulder 04/24/2022   Hypersomnia 07/18/2019   Polyuria 07/18/2019   Adverse effect of testosterone  07/18/2019   History of tobacco abuse 07/18/2019   Paroxysmal atrial fibrillation (HCC) 05/20/2019   Peripheral edema 05/07/2018    Allergies:  Allergies  Allergen Reactions   Coconut (Cocos Nucifera) Swelling   Amoxicillin Hives  and Other (See Comments)    Unsure of reaction, was a long time ago. Unsure of reaction, was a long time ago.    Medications:  Current Outpatient Medications:    doxycycline  (VIBRA -TABS) 100 MG tablet, Take 1 tablet (100 mg total) by mouth 2 (two) times daily., Disp: 14 tablet, Rfl: 0   fluconazole (DIFLUCAN) 150 MG tablet, Take  1 tablet (150 mg total) by mouth once for 1 dose., Disp: 1 tablet, Rfl: 0   triamcinolone cream (KENALOG) 0.1 %, Apply 1 Application topically 2 (two) times daily., Disp: 30 g, Rfl: 0   ibuprofen (ADVIL) 600 MG tablet, Take 600 mg by mouth every 6 (six) hours as needed. for pain, Disp: , Rfl:    ketoconazole  (NIZORAL ) 2 % cream, Apply 1 Application topically daily., Disp: 15 g, Rfl: 0   Multiple Vitamin (MULTIVITAMIN ADULT PO), Take 1 tablet by mouth daily., Disp: , Rfl:    SYRINGE-NEEDLE, DISP, 3 ML 21G X 1-1/2 3 ML MISC, Use to inject testosterone  every week, Disp: 50 each, Rfl: 1   testosterone  cypionate (DEPOTESTOTERONE CYPIONATE) 100 MG/ML injection, Inject 1 mL (100 mg total) into the muscle every 7 (seven) days. For IM use only, Disp: 10 mL, Rfl: 0  Observations/Objective: Patient is well-developed, well-nourished in no acute distress.  Resting comfortably at home.  Head is normocephalic, atraumatic.  No labored breathing.  Speech is clear and coherent with logical content.  Patient is alert and oriented at baseline.    Assessment and Plan: 1. Cellulitis of right upper extremity (Primary) - doxycycline  (VIBRA -TABS) 100 MG tablet; Take 1 tablet (100 mg total) by mouth 2 (two) times daily.  Dispense: 14 tablet; Refill: 0 - triamcinolone cream (KENALOG) 0.1 %; Apply 1 Application topically 2 (two) times daily.  Dispense: 30 g; Refill: 0  - Suspect possible cellulitis - Doxycycline  prescribed - Triamcinolone prescribed for topical use to reduce redness, swelling, and itching - Cold compresses - Seek in person evaluation if worsening  2. Candidal intertrigo - ketoconazole  (NIZORAL ) 2 % cream; Apply 1 Application topically daily.  Dispense: 15 g; Refill: 0 - fluconazole (DIFLUCAN) 150 MG tablet; Take 1 tablet (150 mg total) by mouth once for 1 dose.  Dispense: 1 tablet; Refill: 0  - Refilled Ketoconazole  cream at patient request for recurrent intertrigo - Added Fluconazole to try  to calm down since reports never fully resolved (reported improved 90% with cream alone previously) - Keep skin clean and dry  Follow Up Instructions: I discussed the assessment and treatment plan with the patient. The patient was provided an opportunity to ask questions and all were answered. The patient agreed with the plan and demonstrated an understanding of the instructions.  A copy of instructions were sent to the patient via MyChart unless otherwise noted below.    The patient was advised to call back or seek an in-person evaluation if the symptoms worsen or if the condition fails to improve as anticipated.    Cody CHRISTELLA Dickinson, PA-C

## 2024-03-16 ENCOUNTER — Other Ambulatory Visit: Payer: Self-pay | Admitting: "Endocrinology

## 2024-03-16 ENCOUNTER — Telehealth: Payer: Self-pay

## 2024-03-16 MED ORDER — TESTOSTERONE ENANTHATE 100 MG/0.5ML ~~LOC~~ SOAJ: SUBCUTANEOUS | 2 refills | Status: DC

## 2024-03-16 MED ORDER — TESTOSTERONE CYPIONATE 200 MG/ML IM SOSY: 100.0000 mg | PREFILLED_SYRINGE | INTRAMUSCULAR | 2 refills | Status: DC

## 2024-03-16 NOTE — Telephone Encounter (Signed)
 Spoke with pt regarding Androgel . He states he has 2 small children at home and is apprehensive about the gel. I called his pharmacy inquiring about the Testosterone  injection. They stated they do have the 200mg  dose of Testosterone . Requesting a Rx for Testosterone  Cypionate 200mg /mL inject 0.5mL every 7 days.

## 2024-03-18 ENCOUNTER — Other Ambulatory Visit: Payer: Self-pay | Admitting: "Endocrinology

## 2024-03-18 MED ORDER — TESTOSTERONE CYPIONATE 200 MG/ML IM SOSY: 100.0000 mg | PREFILLED_SYRINGE | INTRAMUSCULAR | 2 refills | Status: DC

## 2024-03-24 ENCOUNTER — Other Ambulatory Visit: Payer: Self-pay | Admitting: "Endocrinology

## 2024-03-24 MED ORDER — TESTOSTERONE CYPIONATE 200 MG/ML IJ SOLN: 100.0000 mg | INTRAMUSCULAR | 0 refills | Status: AC

## 2024-03-29 ENCOUNTER — Ambulatory Visit: Admitting: "Endocrinology

## 2024-04-07 ENCOUNTER — Telehealth: Admitting: Physician Assistant

## 2024-04-07 DIAGNOSIS — J069 Acute upper respiratory infection, unspecified: Secondary | ICD-10-CM | POA: Diagnosis not present

## 2024-04-07 MED ORDER — DOXYCYCLINE HYCLATE 100 MG PO TABS: 100.0000 mg | ORAL_TABLET | Freq: Two times a day (BID) | ORAL | 0 refills | Status: AC

## 2024-04-07 MED ORDER — PROMETHAZINE-DM 6.25-15 MG/5ML PO SYRP: 5.0000 mL | ORAL_SOLUTION | Freq: Four times a day (QID) | ORAL | 0 refills | Status: AC | PRN

## 2024-04-07 MED ORDER — FLUTICASONE PROPIONATE 50 MCG/ACT NA SUSP: 2.0000 | Freq: Every day | NASAL | 0 refills | Status: AC

## 2024-04-07 NOTE — Patient Instructions (Signed)
 Cody Kramer, thank you for joining Cody Velma Lunger, PA-C for today's virtual visit.  While this provider is not your primary care provider (PCP), if your PCP is located in our provider database this encounter information will be shared with them immediately following your visit.   A [facility] MyChart account gives you access to today's visit and all your visits, tests, and labs performed at [facility]  click here if you don't have a [facility] MyChart account or go to [email or web address]  Consent: (Patient) Cody Kramer provided verbal consent for this virtual visit at the beginning of the encounter.  Current Medications:  Current Outpatient Medications:    doxycycline  (VIBRA -TABS) 100 MG tablet, Take 1 tablet (100 mg total) by mouth 2 (two) times daily., Disp: 14 tablet, Rfl: 0   ibuprofen (ADVIL) 600 MG tablet, Take 600 mg by mouth every 6 (six) hours as needed. for pain, Disp: , Rfl:    ketoconazole  (NIZORAL ) 2 % cream, Apply 1 Application topically daily., Disp: 15 g, Rfl: 0   Multiple Vitamin (MULTIVITAMIN ADULT PO), Take 1 tablet by mouth daily., Disp: , Rfl:    SYRINGE-NEEDLE, DISP, 3 ML 21G X 1-1/2 3 ML MISC, Use to inject testosterone  every week, Disp: 50 each, Rfl: 1   Testosterone  Cypionate 200 MG/ML SOLN, Inject 100 mg as directed once a week., Disp: 30 mL, Rfl: 0   triamcinolone  cream (KENALOG ) 0.1 %, Apply 1 Application topically 2 (two) times daily., Disp: 30 g, Rfl: 0   Medications ordered in this encounter:  No orders of the defined types were placed in this encounter.    *If you need refills on other medications prior to your next appointment, please contact your pharmacy*  Follow-Up: Call back or seek an in-person evaluation if the symptoms worsen or if the condition fails to improve as anticipated.  [facility] [phone]  Other Instructions Viral Respiratory Infection A viral respiratory infection is an  illness that affects parts of the body that are used for breathing. These include the lungs, nose, and throat. It is caused by a germ called a virus. Some examples of this kind of infection are: A cold. The flu (influenza). A respiratory syncytial virus (RSV) infection. What are the causes? This condition is caused by a virus. It spreads from person to person. You can get the virus if: You breathe in droplets from someone who is sick. You come in contact with people who are sick. You touch mucus or other fluid from a person who is sick. What are the signs or symptoms? Symptoms of this condition include: A stuffy or runny nose. A sore throat. A cough. Shortness of breath. Trouble breathing. Yellow or green fluid in the nose. Other symptoms may include: A fever. Sweating or chills. Tiredness (fatigue). Achy muscles. A headache. How is this treated? This condition may be treated with: Medicines that treat viruses. Medicines that make it easy to breathe. Medicines that are sprayed into the nose. Acetaminophen or NSAIDs, such as ibuprofen, to treat fever. Follow these instructions at home: Managing pain and congestion Take over-the-counter and prescription medicines only as told by your doctor. If you have a sore throat, gargle with salt water. Do this 3-4 times a day or as needed. To make salt water, dissolve -1 tsp (3-6 g) of salt in 1 cup (237 mL) of warm water. Make sure that all the salt dissolves. Use nose drops made from salt water. This helps with stuffiness (  congestion). It also helps soften the skin around your nose. Take 2 tsp (10 mL) of honey at bedtime to lessen coughing at night. Do not give honey to children who are younger than 87 year old. Drink enough fluid to keep your pee (urine) pale yellow. General instructions  Rest as much as possible. Do not drink alcohol. Do not smoke or use any products that contain nicotine or tobacco. If you need help quitting, ask  your doctor. Keep all follow-up visits. How is this prevented?     Get a flu shot every year. Ask your doctor when you should get your flu shot. Do not let other people get your germs. If you are sick: Wash your hands with soap and water often. Wash your hands after you cough or sneeze. Wash hands for at least 20 seconds. If you cannot use soap and water, use hand sanitizer. Cover your mouth when you cough. Cover your nose and mouth when you sneeze. Do not share cups or eating utensils. Clean commonly used objects often. Clean commonly touched surfaces. Stay home from work or school. Avoid contact with people who are sick during cold and flu season. This is in fall and winter. Get help if: Your symptoms last for 10 days or longer. Your symptoms get worse over time. You have very bad pain in your face or forehead. Parts of your jaw or neck get very swollen. You have shortness of breath. Get help right away if: You feel pain or pressure in your chest. You have trouble breathing. You faint or feel like you will faint. You keep vomiting and it gets worse. You feel confused. These symptoms may be an emergency. Get help right away. Call your local emergency services (911 in the [place] Do not wait to see if the symptoms will go away. Do not drive yourself to the hospital. Summary A viral respiratory infection is an illness that affects parts of the body that are used for breathing. Examples of this illness include a cold, the flu, and a respiratory syncytial virus (RSV) infection. The infection can cause a runny nose, cough, sore throat, and fever. Follow what your doctor tells you about taking medicines, drinking lots of fluid, washing your hands, resting at home, and avoiding people who are sick. This information is not intended to replace advice given to you by your health care provider. Make sure you discuss any questions you have with your health care provider. Document Revised:  08/03/2020 Document Reviewed: 08/03/2020 Elsevier Patient Education  2024 Elsevier [employer].   If you have been instructed to have an in-person evaluation today at a local Urgent Care facility, please use the link below. It will take you to a list of all of our available [facility], including address, phone number and hours of operation. Please do not delay care.  [facility]  If you or a family member do not have a primary care provider, use the link below to schedule a visit and establish care. When you choose a [facility] primary care physician or advanced practice provider, you gain a long-term partner in health. Find a Primary Care Provider  Learn more about [facility]'s in-office and virtual care options: [facility] - Get Care Now

## 2024-04-07 NOTE — Progress Notes (Signed)
 Virtual Visit Consent   [name], you are scheduled for a virtual visit with a [facility] provider today. Just as with appointments in the office, your consent must be obtained to participate. Your consent will be active for this visit and any virtual visit you may have with one of our providers in the next 365 days. If you have a MyChart account, a copy of this consent can be sent to you electronically.  As this is a virtual visit, video technology does not allow for your provider to perform a traditional examination. This may limit your provider's ability to fully assess your condition. If your provider identifies any concerns that need to be evaluated in person or the need to arrange testing (such as labs, EKG, etc.), we will make arrangements to do so. Although advances in technology are sophisticated, we cannot ensure that it will always work on either your end or our end. If the connection with a video visit is poor, the visit may have to be switched to a telephone visit. With either a video or telephone visit, we are not always able to ensure that we have a secure connection.  By engaging in this virtual visit, you consent to the provision of healthcare and authorize for your insurance to be billed (if applicable) for the services provided during this visit. Depending on your insurance coverage, you may receive a charge related to this service.  I need to obtain your verbal consent now. Are you willing to proceed with your visit today? Cody Kramer has provided verbal consent on 04/07/2024 for a virtual visit (video or telephone). Cody Kramer, [place]  Date: 04/07/2024 12:19 PM   Virtual Visit via Video Note   I, Cody Kramer, connected with  BONNY EGGER  ([id number], 1991-03-15) on 04/07/24 at 12:15 PM EST by a video-enabled telemedicine application and verified that I am speaking with the correct person using two identifiers.  Location: Patient:  Virtual Visit Location Patient: Home Provider: Virtual Visit Location Provider: Home Office   I discussed the limitations of evaluation and management by telemedicine and the availability of in person appointments. The patient expressed understanding and agreed to proceed.    History of Present Illness: Cody Kramer is a 33 y.o. who identifies as a male who was assigned male at birth, and is being seen today for 2 days of nasal congestion, chest congestion, dry cough and mild sweats without fever or chills. Denies chest pain or SOB. Children and now wife with similar symptoms. Is mainly concerned because he and wife are leaving out of state Saturday for a long vacation.  OTC -- Tylenol Cold and Sinus   HPI: HPI  Problems:  Patient Active Problem List   Diagnosis Date Noted   Candidal intertrigo 06/16/2023   Pain in left testicle 06/16/2023   Anxiety 06/16/2023   Elevated blood pressure reading 06/16/2023   Hypogonadism, male 08/14/2022   Mixed hyperlipidemia 08/14/2022   Respiratory illness with fever 07/16/2022   Encounter for general adult medical examination with abnormal findings 06/04/2022   Morbid obesity (HCC) 06/04/2022   Tendinitis of left shoulder 04/24/2022   Hypersomnia 07/18/2019   Polyuria 07/18/2019   Adverse effect of testosterone  07/18/2019   History of tobacco abuse 07/18/2019   Paroxysmal atrial fibrillation (HCC) 05/20/2019   Peripheral edema 05/07/2018    Allergies:  Allergies  Allergen Reactions   Coconut (Cocos Nucifera) Swelling   Amoxicillin Hives and Other (See Comments)  Unsure of reaction, was a long time ago. Unsure of reaction, was a long time ago.    Medications:  Current Outpatient Medications:    doxycycline  (VIBRA -TABS) 100 MG tablet, Take 1 tablet (100 mg total) by mouth 2 (two) times daily., Disp: 14 tablet, Rfl: 0   fluticasone  (FLONASE ) 50 MCG/ACT nasal spray, Place 2 sprays into both nostrils daily., Disp: 16 g, Rfl: 0    promethazine -dextromethorphan (PROMETHAZINE -DM) 6.25-15 MG/5ML syrup, Take 5 mLs by mouth 4 (four) times daily as needed for cough., Disp: 118 mL, Rfl: 0   ibuprofen (ADVIL) 600 MG tablet, Take 600 mg by mouth every 6 (six) hours as needed. for pain, Disp: , Rfl:    ketoconazole  (NIZORAL ) 2 % cream, Apply 1 Application topically daily., Disp: 15 g, Rfl: 0   Multiple Vitamin (MULTIVITAMIN ADULT PO), Take 1 tablet by mouth daily., Disp: , Rfl:    SYRINGE-NEEDLE, DISP, 3 ML 21G X 1-1/2 3 ML MISC, Use to inject testosterone  every week, Disp: 50 each, Rfl: 1   Testosterone  Cypionate 200 MG/ML SOLN, Inject 100 mg as directed once a week., Disp: 30 mL, Rfl: 0   triamcinolone  cream (KENALOG ) 0.1 %, Apply 1 Application topically 2 (two) times daily., Disp: 30 g, Rfl: 0  Observations/Objective: Patient is well-developed, well-nourished in no acute distress.  Resting comfortably  at home.  Head is normocephalic, atraumatic.  No labored breathing.  Speech is clear and coherent with logical content.  Patient is alert and oriented at baseline.   Assessment and Plan: 1. Acute URI (Primary) - promethazine -dextromethorphan (PROMETHAZINE -DM) 6.25-15 MG/5ML syrup; Take 5 mLs by mouth 4 (four) times daily as needed for cough.  Dispense: 118 mL; Refill: 0 - fluticasone  (FLONASE ) 50 MCG/ACT nasal spray; Place 2 sprays into both nostrils daily.  Dispense: 16 g; Refill: 0 - doxycycline  (VIBRA -TABS) 100 MG tablet; Take 1 tablet (100 mg total) by mouth 2 (two) times daily.  Dispense: 14 tablet; Refill: 0  Discussed most likely viral in nature giving family with similar symptoms. Supportive measures and OTC medications reviewed. Start Flonase  and Promethazine -DM per orders. Will give script for Doxycycline  to have on hand while out of state in case of any non-resolving, new or worsening symptoms after this week.  Follow Up Instructions: I discussed the assessment and treatment plan with the patient. The patient was  provided an opportunity to ask questions and all were answered. The patient agreed with the plan and demonstrated an understanding of the instructions.  A copy of instructions were sent to the patient via MyChart unless otherwise noted below.  The patient was advised to call back or seek an in-person evaluation if the symptoms worsen or if the condition fails to improve as anticipated.    Cody Velma Lunger, PA-C

## 2024-05-03 ENCOUNTER — Ambulatory Visit: Admitting: "Endocrinology

## 2024-07-01 ENCOUNTER — Encounter

## 2024-07-13 ENCOUNTER — Ambulatory Visit: Admitting: "Endocrinology
